# Patient Record
Sex: Female | Born: 1955 | Race: White | Hispanic: No | Marital: Married | Smoking: Current every day smoker
Health system: Southern US, Community
[De-identification: ages and names within clinical notes are randomized; demographics above are authoritative.]

## PROBLEM LIST (undated history)

## (undated) DIAGNOSIS — C801 Malignant (primary) neoplasm, unspecified: Secondary | ICD-10-CM

## (undated) DIAGNOSIS — I10 Essential (primary) hypertension: Secondary | ICD-10-CM

## (undated) HISTORY — PX: LAPAROSCOPIC LYSIS OF ADHESIONS: SHX5905

## (undated) HISTORY — PX: NASAL SEPTUM SURGERY: SHX37

---

## 2001-02-16 HISTORY — PX: BREAST BIOPSY: SHX20

## 2009-01-21 ENCOUNTER — Ambulatory Visit: Payer: Self-pay | Admitting: Obstetrics & Gynecology

## 2009-02-06 ENCOUNTER — Ambulatory Visit: Payer: Self-pay | Admitting: Obstetrics & Gynecology

## 2010-02-09 ENCOUNTER — Ambulatory Visit: Payer: Self-pay | Admitting: Obstetrics & Gynecology

## 2010-02-12 ENCOUNTER — Ambulatory Visit: Payer: Self-pay | Admitting: Obstetrics & Gynecology

## 2010-03-11 ENCOUNTER — Ambulatory Visit: Payer: Self-pay | Admitting: Surgery

## 2010-03-11 HISTORY — PX: BREAST BIOPSY: SHX20

## 2010-03-12 LAB — PATHOLOGY REPORT

## 2011-03-11 ENCOUNTER — Ambulatory Visit: Payer: Self-pay | Admitting: Obstetrics and Gynecology

## 2011-11-13 IMAGING — US ULTRASOUND LEFT BREAST
1 series · 13 of 13 positions shown · non-contrast
Comparison: none

REASON FOR EXAM: av lt mass
COMMENTS:

[Series 1: ultrasound left breast · 13 of 13 slices shown]
[im 1/13]
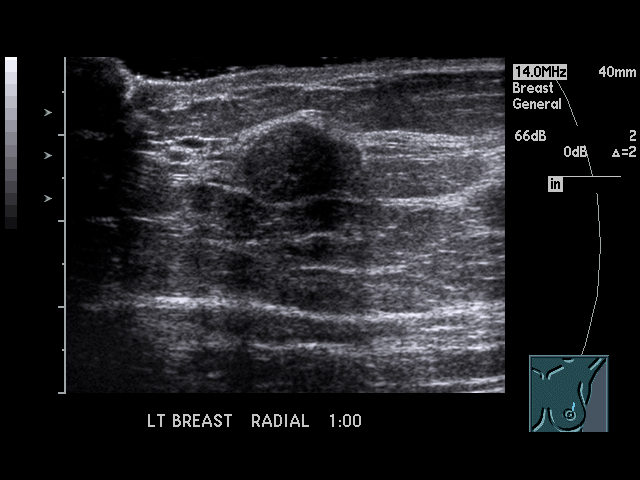
[im 2/13]
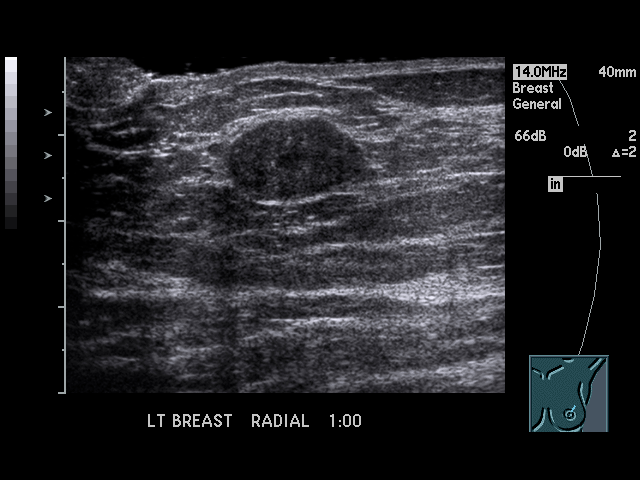
[im 3/13]
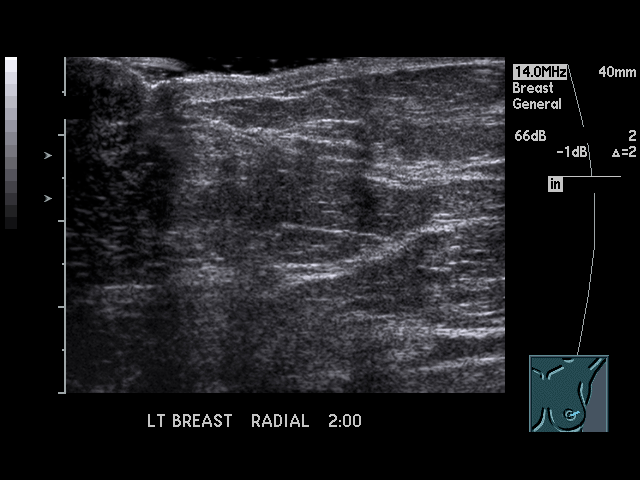
[im 4/13]
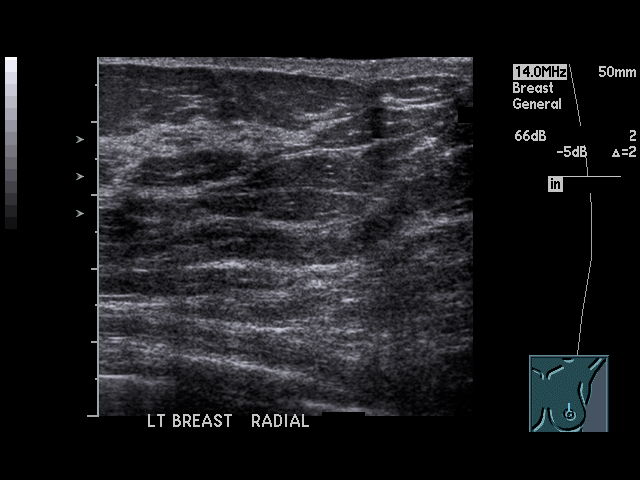
[im 5/13]
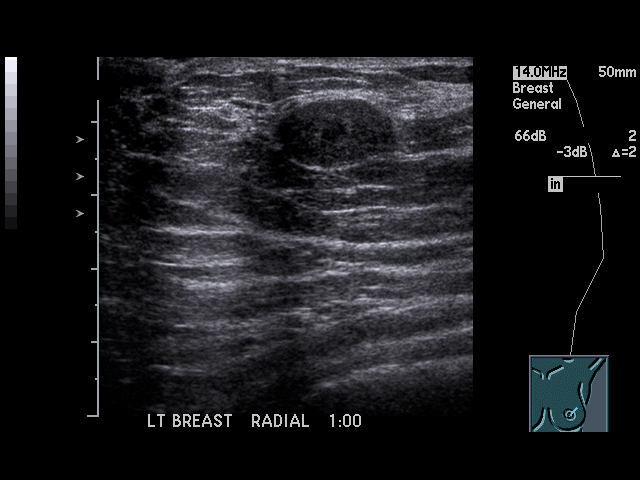
[im 6/13]
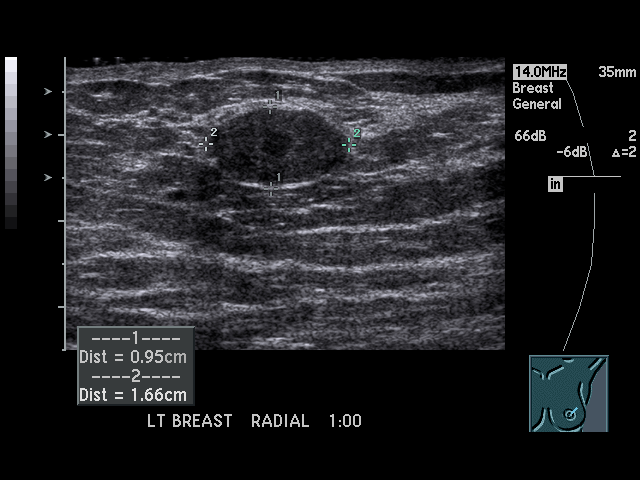
[im 7/13]
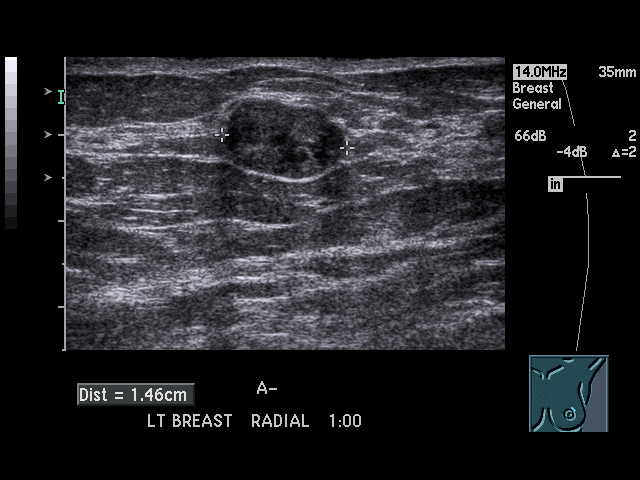
[im 8/13]
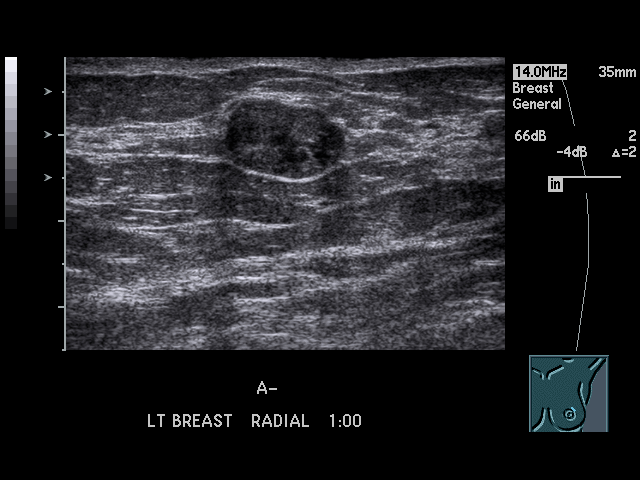
[im 9/13]
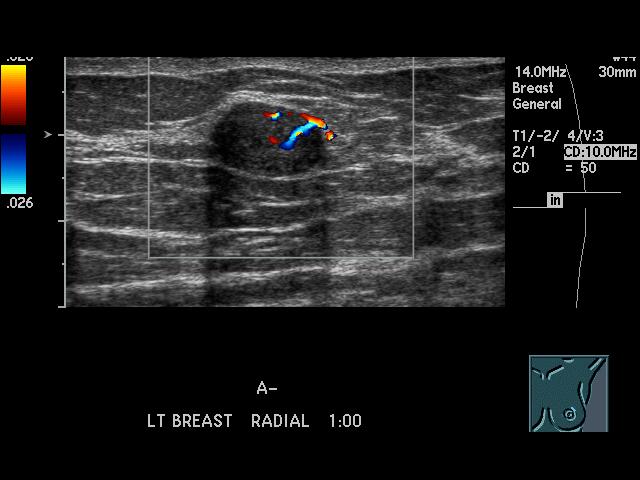
[im 10/13]
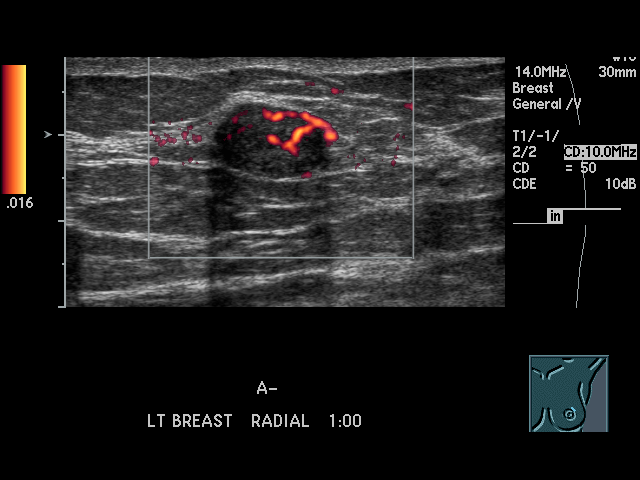
[im 11/13]
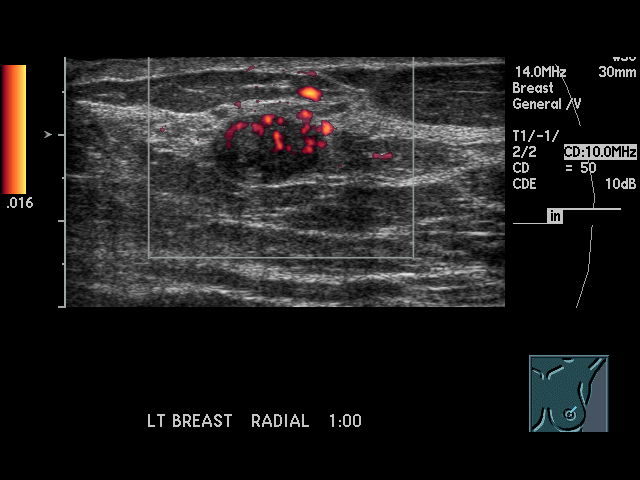
[im 12/13]
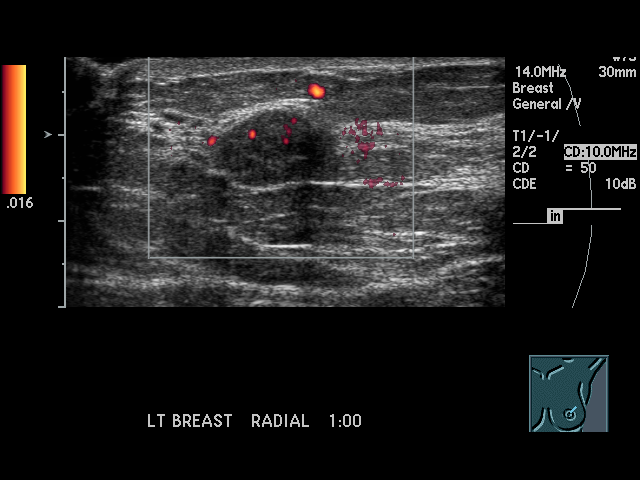
[im 13/13]
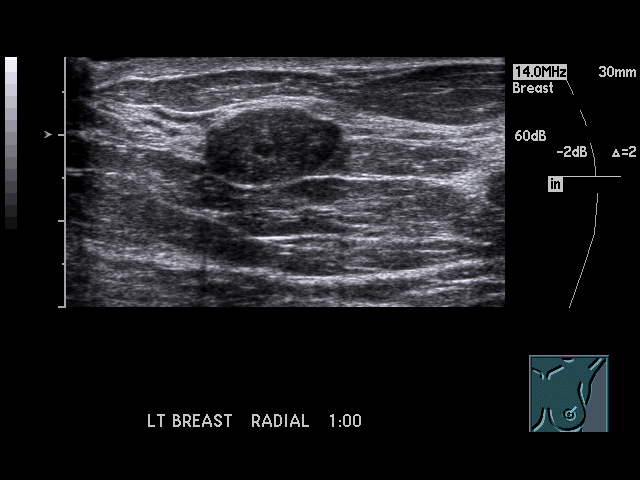

[13 of 13 positions shown; findings below may reference images not displayed]

PROCEDURE:     US  - US BREAST LEFT  - February 12, 2010  [DATE]

RESULT:

The patient had screening mammography on February 09, 2010 and returns for
additional views of the right breast on this date. Spot compression views
and a medial lateral view of the left breast were obtained. There is again
observed a mass anteriorly in the left breast located lateral to the nipple
and slightly above the nipple. The margins appear smooth. The mass measures
approximately 18 mm x 13 mm as measured mammographically. This mass is
larger than was evident on the prior screening mammogram February 06, 2009
at which time the mass measured approximately 12 mm x 10 mm. Note is made
that the patient had a similar mass described on prior mammography and prior
ultrasound examinations in 0440 and underwent biopsy of the mass at that
time.

Targeted ultrasound was also performed on this date and reveals a solid
sharply circumscribed mass superficially at 1 o'clock and measuring 1.66 cm
x 0.95 cm. The mass is vascular and there is observed posterior attenuation
of the echo beam. The mass at this time is only slightly larger than that
reported on the exam of 0440. Nonetheless, there has been a definite
increase in size in the mass as compared to the previous mammogram February 06, 2009. In view of the recent change in size, it is recommended
that the patient undergo repeat biopsy unless the finding at pathology on
prior biopsy represented a lymph node which is a distinct possibility and
which might wax and Sc in size.
IMPRESSION: 1. There is a superficial 18 mm mass anteriorly in the left breast at 1
o'clock that has increased in size since the prior mammogram February 06, 2009 but which is not dissimilar in size to that reported on prior
examinations of 0440 at which time the mass underwent biopsy. Unless the
mass on prior biopsy was shown to represent a lymph node which might be
expected to increase and decrease in size periodically, rebiopsy is
recommended in view of the change in size since the mammogram [DATE]. BIRADS:   Category 4-Suspicious Abnormality.
3. The pathology report for the prior biopsy in 0440 is not available to me
at the time of this dictation.

## 2011-12-10 IMAGING — US US BREAST BX W LOC DEV 1ST LESION IMG BX SPEC US GUIDE
1 series · 15 of 15 positions shown · non-contrast
Comparison: none

REASON FOR EXAM: Lt Breast Mass
COMMENTS:

[Series 1: us breast bx w loc dev 1st lesion img bx spec us g · 15 of 15 slices shown]
[im 1/15]
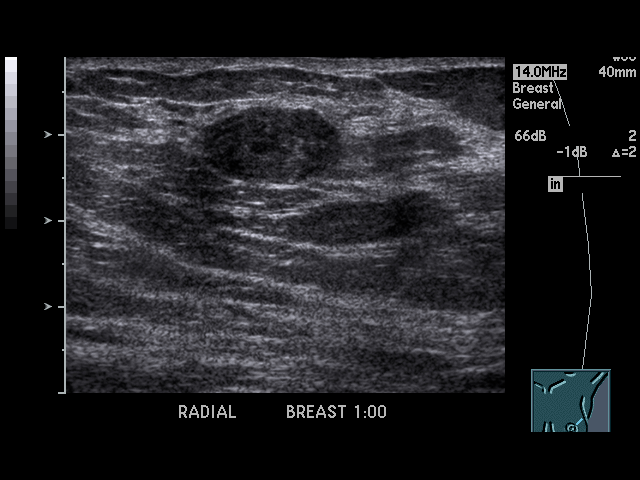
[im 2/15]
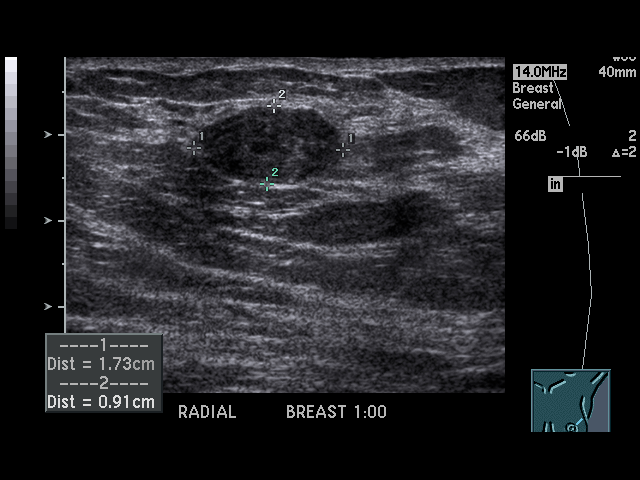
[im 3/15]
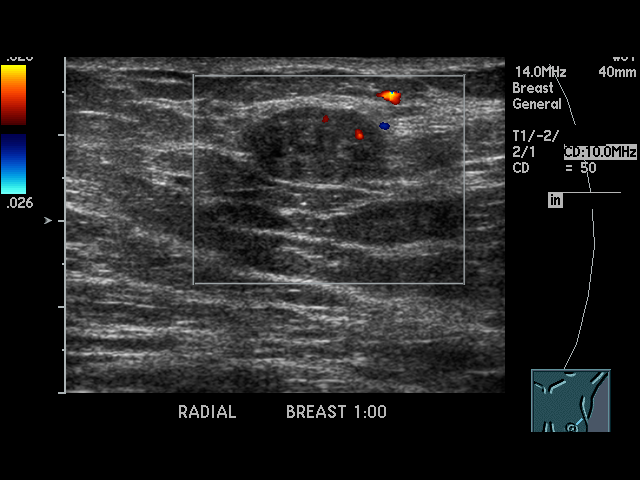
[im 4/15]
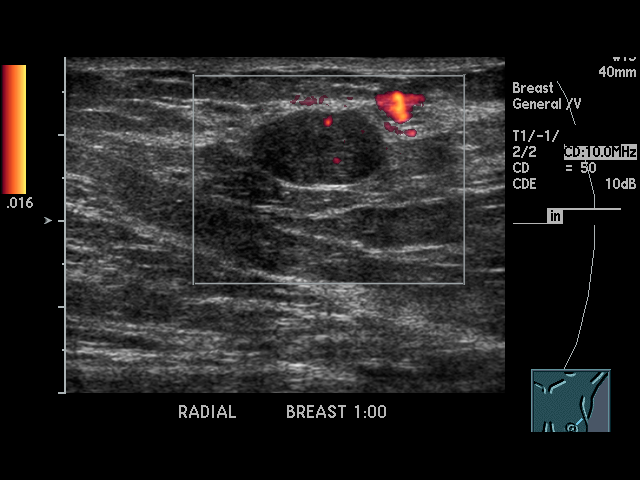
[im 5/15]
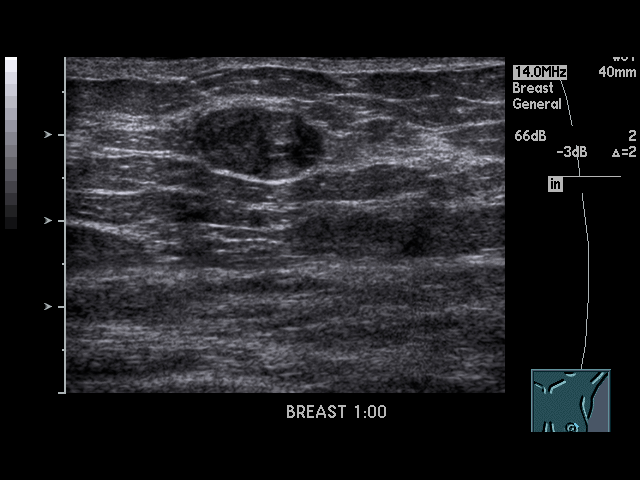
[im 6/15]
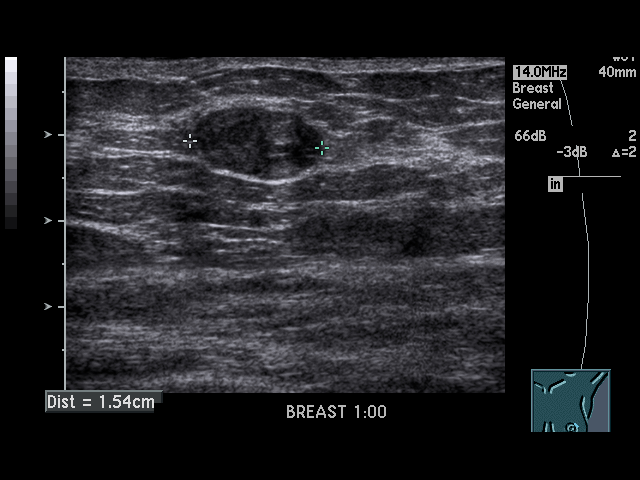
[im 7/15]
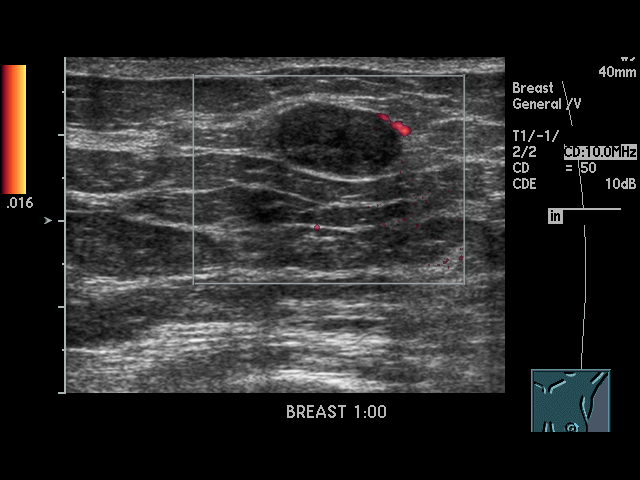
[im 8/15]
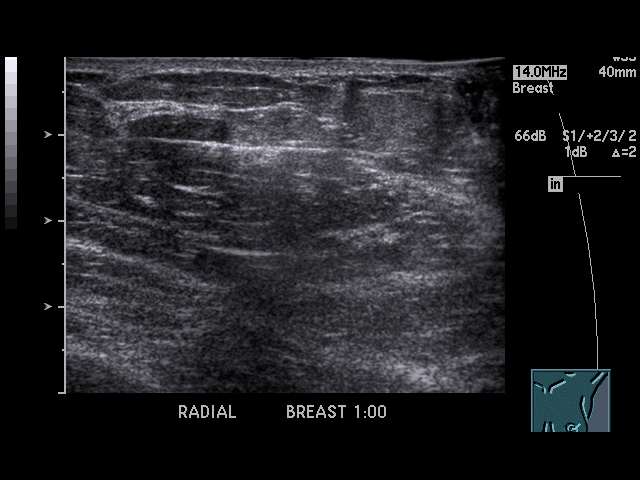
[im 9/15]
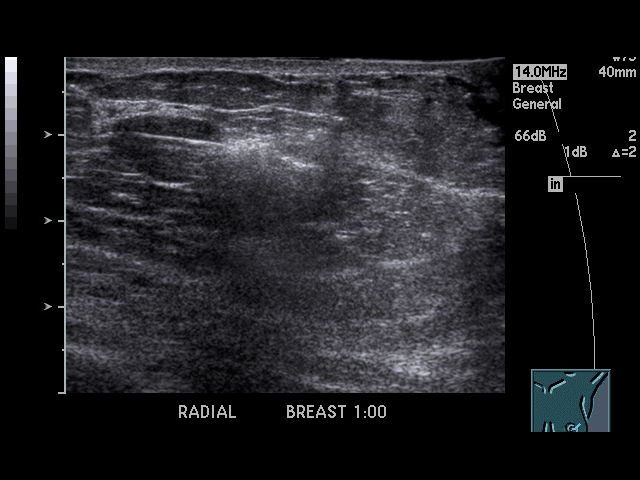
[im 10/15]
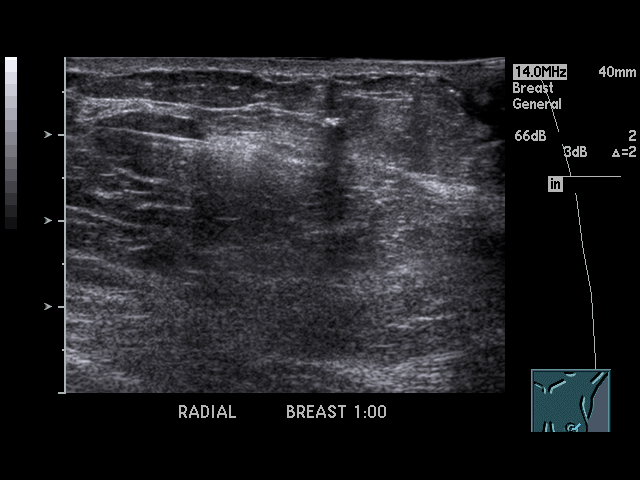
[im 11/15]
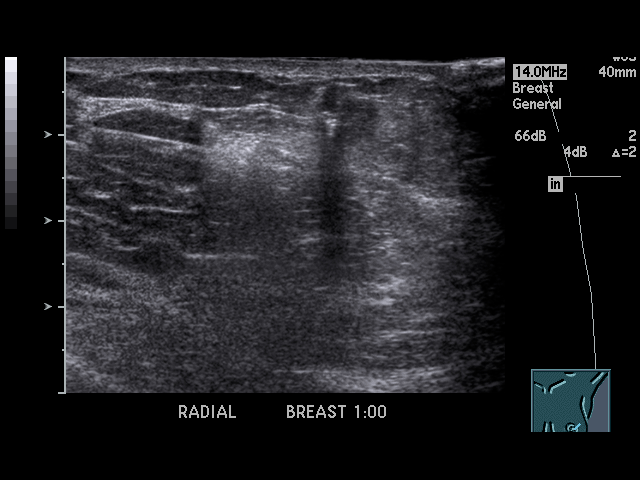
[im 12/15]
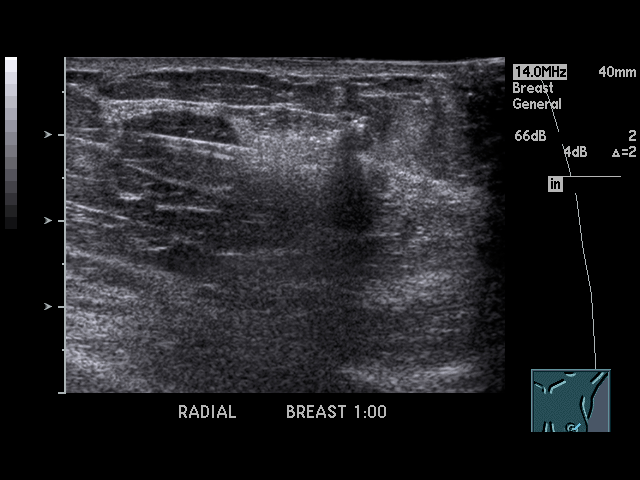
[im 13/15]
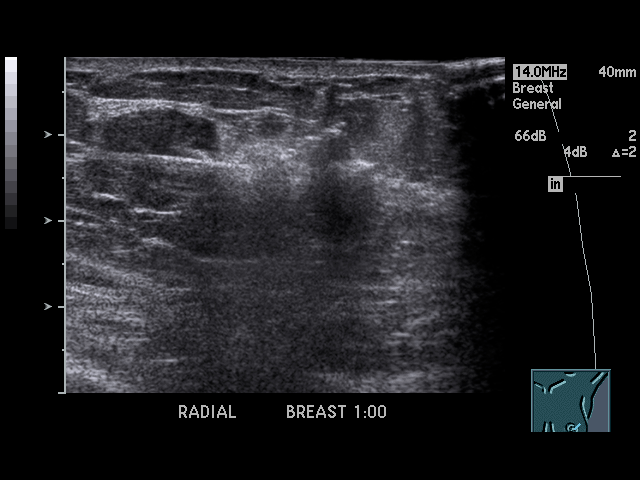
[im 14/15]
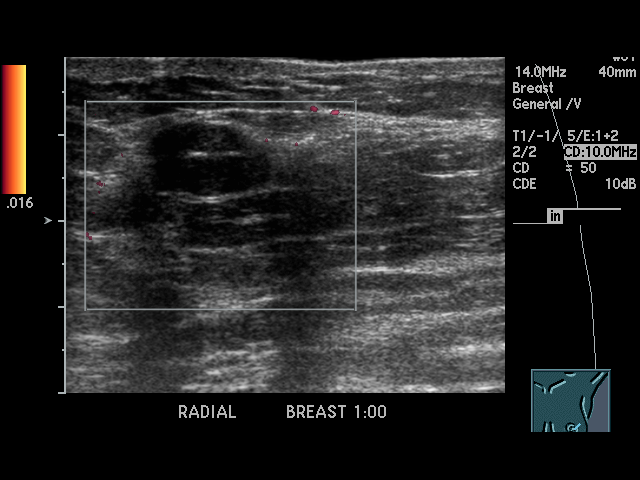
[im 15/15]
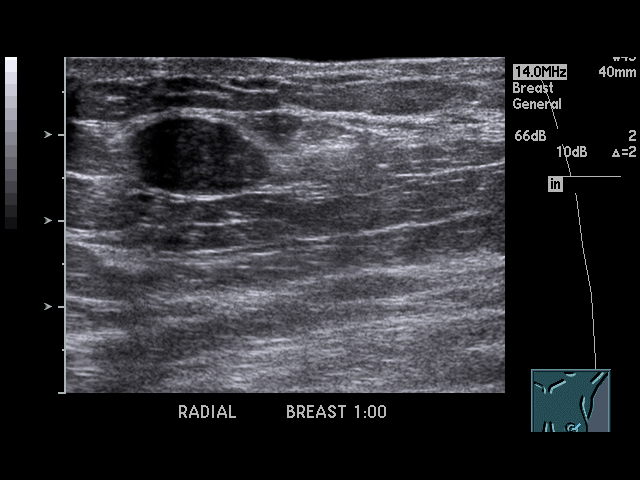

[15 of 15 positions shown; findings below may reference images not displayed]

PROCEDURE:     US  - US GUIDED BIOPSY BREAST LEFT  - March 11, 2010  [DATE]

RESULT:     Comparison: Ultrasound of the left breast 02/12/2010

Consent: After the procedure and its risks including infection, bleeding and
allergy were discussed with the patient and all questions answered, informed
consent was signed.

Procedure: The patient was placed supine on the ultrasound with the left arm
in ABER position. A "timeout" was performed to verify the correct patient
and correct procedure. Initial ultrasound of the left breast demonstrated
solid mass there is wider than tall at [DATE]. The patient was then prepped
and draped in the usual sterile fashion. Realtime ultrasound was used to
localize the mass and a biopsy path was selected. The skin was marked and 1%
lidocaine was infiltrated into skin and soft tissues along the biopsy tract
up to the mass for local anesthesia.

A small skin incision with a #11 blade was made at the skin entry site. A
01gauge coaxial needle was advanced to the margin of the mass. Subsequently,
6 core biopsy specimens were obtained using an Achieve 18 gauge needle in
semiautomatic mode. Needle passes into the mass were observed under realtime
and recorded. The needle was removed, hemostasis achieved and a band-aid
applied to the skin incision. The patient tolerated the procedure well
without immediate complication and was discharged in good condition after 15
min observation.
IMPRESSION: Successful left breast mass core biopsy.

## 2013-01-23 ENCOUNTER — Ambulatory Visit: Payer: Self-pay | Admitting: Nurse Practitioner

## 2014-01-31 ENCOUNTER — Ambulatory Visit: Payer: Self-pay | Admitting: Family Medicine

## 2015-01-20 ENCOUNTER — Other Ambulatory Visit: Payer: Self-pay | Admitting: Family Medicine

## 2015-01-20 DIAGNOSIS — Z1231 Encounter for screening mammogram for malignant neoplasm of breast: Secondary | ICD-10-CM

## 2015-02-11 ENCOUNTER — Ambulatory Visit
Admission: RE | Admit: 2015-02-11 | Discharge: 2015-02-11 | Disposition: A | Discharge status: Home / Self Care | Payer: Managed Care, Other (non HMO) | Source: Ambulatory Visit | Attending: Family Medicine | Admitting: Family Medicine

## 2015-02-11 DIAGNOSIS — Z1231 Encounter for screening mammogram for malignant neoplasm of breast: Secondary | ICD-10-CM

## 2015-02-11 HISTORY — DX: Malignant (primary) neoplasm, unspecified: C80.1

## 2016-01-28 ENCOUNTER — Other Ambulatory Visit: Payer: Self-pay | Admitting: Family Medicine

## 2016-01-28 DIAGNOSIS — Z1231 Encounter for screening mammogram for malignant neoplasm of breast: Secondary | ICD-10-CM

## 2016-03-02 ENCOUNTER — Ambulatory Visit
Admission: RE | Admit: 2016-03-02 | Discharge: 2016-03-02 | Disposition: A | Discharge status: Home / Self Care | Payer: Managed Care, Other (non HMO) | Source: Ambulatory Visit | Attending: Family Medicine | Admitting: Family Medicine

## 2016-03-02 DIAGNOSIS — Z1231 Encounter for screening mammogram for malignant neoplasm of breast: Secondary | ICD-10-CM

## 2016-03-02 DIAGNOSIS — N6489 Other specified disorders of breast: Secondary | ICD-10-CM | POA: Diagnosis not present

## 2017-01-28 ENCOUNTER — Other Ambulatory Visit: Payer: Self-pay | Admitting: Family Medicine

## 2017-01-28 DIAGNOSIS — Z1239 Encounter for other screening for malignant neoplasm of breast: Secondary | ICD-10-CM

## 2017-03-03 ENCOUNTER — Encounter (INDEPENDENT_AMBULATORY_CARE_PROVIDER_SITE_OTHER): Payer: Self-pay

## 2017-03-03 ENCOUNTER — Ambulatory Visit
Admission: RE | Admit: 2017-03-03 | Discharge: 2017-03-03 | Disposition: A | Discharge status: Home / Self Care | Payer: 59 | Source: Ambulatory Visit | Attending: Family Medicine | Admitting: Family Medicine

## 2017-03-03 DIAGNOSIS — Z1239 Encounter for other screening for malignant neoplasm of breast: Secondary | ICD-10-CM

## 2017-03-03 DIAGNOSIS — Z1231 Encounter for screening mammogram for malignant neoplasm of breast: Secondary | ICD-10-CM | POA: Insufficient documentation

## 2018-03-27 ENCOUNTER — Telehealth: Payer: Self-pay | Admitting: *Deleted

## 2018-03-27 DIAGNOSIS — Z122 Encounter for screening for malignant neoplasm of respiratory organs: Secondary | ICD-10-CM

## 2018-03-27 DIAGNOSIS — Z87891 Personal history of nicotine dependence: Secondary | ICD-10-CM

## 2018-03-27 NOTE — Telephone Encounter (Signed)
Received referral for initial lung cancer screening scan. Contacted patient and obtained smoking history,(current, 72 pack year) as well as answering questions related to screening process. Patient denies signs of lung cancer such as weight loss or hemoptysis. Patient denies comorbidity that would prevent curative treatment if lung cancer were found. Patient is scheduled for shared decision making visit and CT scan on 04/06/18 at 2pm.

## 2018-04-06 ENCOUNTER — Ambulatory Visit: Payer: Self-pay

## 2018-04-06 ENCOUNTER — Inpatient Hospital Stay: Payer: Self-pay | Admitting: Nurse Practitioner

## 2018-04-19 ENCOUNTER — Telehealth: Payer: Self-pay | Admitting: *Deleted

## 2018-04-19 NOTE — Telephone Encounter (Signed)
Called pt to remind her of her ldct screening on 04-20-2018@1400 , pt rescheduled to Monday already spoke with shawn perkins rn

## 2018-04-20 ENCOUNTER — Inpatient Hospital Stay: Payer: Self-pay | Admitting: Nurse Practitioner

## 2018-04-20 ENCOUNTER — Ambulatory Visit: Payer: No Typology Code available for payment source

## 2018-04-21 ENCOUNTER — Telehealth: Payer: Self-pay | Admitting: *Deleted

## 2018-04-21 NOTE — Telephone Encounter (Signed)
Called pt to inform her of her appt for ldct screening on Monday 04-24-2018@1515 , voiced understanding

## 2018-04-24 ENCOUNTER — Inpatient Hospital Stay: Payer: No Typology Code available for payment source | Attending: Nurse Practitioner | Admitting: Oncology

## 2018-04-24 ENCOUNTER — Encounter: Payer: Self-pay | Admitting: Oncology

## 2018-04-24 ENCOUNTER — Ambulatory Visit
Admission: RE | Admit: 2018-04-24 | Discharge: 2018-04-24 | Disposition: A | Discharge status: Home / Self Care | Payer: No Typology Code available for payment source | Source: Ambulatory Visit | Attending: Nurse Practitioner | Admitting: Nurse Practitioner

## 2018-04-24 DIAGNOSIS — Z87891 Personal history of nicotine dependence: Secondary | ICD-10-CM | POA: Insufficient documentation

## 2018-04-24 DIAGNOSIS — Z122 Encounter for screening for malignant neoplasm of respiratory organs: Secondary | ICD-10-CM | POA: Diagnosis present

## 2018-04-24 NOTE — Progress Notes (Signed)
In accordance with CMS guidelines, patient has met eligibility criteria including age, absence of signs or symptoms of lung cancer.  Social History   Tobacco Use  . Smoking status: Current Every Day Smoker    Packs/day: 1.50    Years: 48.00    Pack years: 72.00    Types: Cigarettes  Substance Use Topics  . Alcohol use: Not on file  . Drug use: Not on file     A shared decision-making session was conducted prior to the performance of CT scan. This includes one or more decision aids, includes benefits and harms of screening, follow-up diagnostic testing, over-diagnosis, false positive rate, and total radiation exposure.  Counseling on the importance of adherence to annual lung cancer LDCT screening, impact of co-morbidities, and ability or willingness to undergo diagnosis and treatment is imperative for compliance of the program.  Counseling on the importance of continued smoking cessation for former smokers; the importance of smoking cessation for current smokers, and information about tobacco cessation interventions have been given to patient including [place] and [employer] quit [place] programs.  Written order for lung cancer screening with LDCT has been given to the patient and any and all questions have been answered to the best of my abilities.   Yearly follow up will be coordinated by Burgess Estelle, Thoracic Navigator.  Faythe Casa, NP 04/24/2018 3:51 PM

## 2018-04-25 ENCOUNTER — Encounter: Payer: Self-pay | Admitting: *Deleted

## 2018-05-03 ENCOUNTER — Encounter: Payer: Self-pay | Admitting: *Deleted

## 2018-05-03 NOTE — H&P (Signed)
Deborah Leon is a 63 y.o. female here for pelvic pain .  Pt returns today for f/up for chronic pelvic pain , right > left . She has 2small simple ovarian cysts. Pain has persisted . Neg CA-125 level . Colonoscopy : 1 polyp and small divericula noted .  Pain is daily with some exacerbations . Bowel prep with colonoscopy- pain dissipated the day after the colonoscopy   prior L/S LOA in 1990  Past Medical History:  has a past medical history of Abnormal cytology (2003), Allergic state, Aortic atherosclerosis (CMS-HCC) (02/19/2017), Arthritis, Cancer (CMS-HCC) (2008), Cataract cortical, senile, Hyperlipidemia, Hypertension, and Simple chronic bronchitis (CMS-HCC) (02/19/2017).  Past Surgical History:  has a past surgical history that includes Deviated septum repair; Diagnostic laparoscopy with lysis of adhesions (1990); S/P breast biopsy ; colposcopy (2002); and Colonoscopy (04/07/2018). Family History: family history includes Alzheimer's disease in her father; Aneurysm in her sister; Diabetes type II in her mother and sister; Factor V Leiden deficiency in her sister; High blood pressure (Hypertension) in her mother, sister, and sister; Stroke in her sister. Social History:  reports that she has been smoking. She has a 40.00 pack-year smoking history. She has never used smokeless tobacco. She reports that she does not drink alcohol or use drugs. OB/GYN History:          OB History    Gravida  3   Para  2   Term      Preterm      AB  1   Living  2     SAB      TAB      Ectopic      Molar      Multiple      Live Births  2          Allergies: is allergic to penicillins. Medications:  Current Outpatient Medications:  .  glucosamine sulfate 500 mg Tab, Take 1 tablet by mouth once daily  , Disp: , Rfl:  .  lisinopril-hydrochlorothiazide (ZESTORETIC) 10-12.5 mg tablet, TAKE 1 TABLET BY MOUTH ONCE DAILY FOR BLOOD PRESSURE, Disp: 90 tablet, Rfl: 0 .  multivitamin tablet,  Take 1 tablet by mouth once daily, Disp: , Rfl:  .  naproxen (NAPROSYN) 500 MG tablet, TAKE 1 TABLET BY MOUTH TWICE DAILY WITH MEALS FOR 15 DAYS, Disp: , Rfl:  .  omega-3 fatty acids/fish oil 340-1,000 mg capsule, Take 1 capsule by mouth 2 (two) times daily., Disp: , Rfl:  .  pravastatin (PRAVACHOL) 10 MG tablet, TAKE 1 TABLET BY MOUTH ONCE DAILY FOR CHOLESTEROL, Disp: 90 tablet, Rfl: 0 .  ondansetron (ZOFRAN-ODT) 4 MG disintegrating tablet, Take 4 mg by mouth every 8 (eight) hours as needed for Nausea  , Disp: , Rfl:   Review of Systems: General:                      No fatigue or weight loss Eyes:                           No vision changes Ears:                            No hearing difficulty Respiratory:                No cough or shortness of breath Pulmonary:  No asthma or shortness of breath Cardiovascular:           No chest pain, palpitations, dyspnea on exertion Gastrointestinal:          No abdominal bloating, chronic diarrhea, constipations, masses, pain or hematochezia Genitourinary:             No hematuria, dysuria, abnormal vaginal discharge, pelvic pain, Menometrorrhagia Lymphatic:                   No swollen lymph nodes Musculoskeletal:         No muscle weakness Neurologic:                  No extremity weakness, syncope, seizure disorder Psychiatric:                  No history of depression, delusions or suicidal/homicidal ideation    Exam:   Vitals:   04/20/18 1417  BP: 124/72  Pulse: 109    Body mass index is 27.12 kg/m.  WDWN white/ female in NAD   Lungs: CTA  CV : RRR without murmur    Neck:  no thyromegaly Abdomen: soft , no mass, normal active bowel sounds,  non-tender, no rebound tenderness Pelvic: tanner stage 5 ,  External genitalia: vulva /labia no lesions Urethra: no prolapse Vagina: normal physiologic d/c Cervix: no lesions, no cervical motion tenderness   Uterus: normal size shape and contour, non-tender Adnexa:  no mass, right adnexal TTP   Rectovaginal:  Impression:   The primary encounter diagnosis was Pelvic pain in female. A diagnosis of Bilateral ovarian cysts was also pertinent to this visit.    Plan:   Given pain has persisted and she had a prior h/o pelvic adhesions I have offered Operative L/S and BSO . she will elect to have this procedure         Caroline Sauger, MD

## 2018-05-04 ENCOUNTER — Encounter
Admission: RE | Admit: 2018-05-04 | Discharge: 2018-05-04 | Disposition: A | Discharge status: Home / Self Care | Payer: No Typology Code available for payment source | Source: Ambulatory Visit | Attending: Obstetrics and Gynecology | Admitting: Obstetrics and Gynecology

## 2018-05-04 ENCOUNTER — Other Ambulatory Visit: Payer: Self-pay

## 2018-05-04 DIAGNOSIS — Z01818 Encounter for other preprocedural examination: Secondary | ICD-10-CM | POA: Diagnosis not present

## 2018-05-04 DIAGNOSIS — I10 Essential (primary) hypertension: Secondary | ICD-10-CM | POA: Diagnosis not present

## 2018-05-04 HISTORY — DX: Essential (primary) hypertension: I10

## 2018-05-04 LAB — BASIC METABOLIC PANEL
Anion gap: 8 (ref 5–15)
BUN: 12 mg/dL (ref 8–23)
CO2: 25 mmol/L (ref 22–32)
Calcium: 9.3 mg/dL (ref 8.9–10.3)
Chloride: 104 mmol/L (ref 98–111)
Creatinine, Ser: 0.64 mg/dL (ref 0.44–1.00)
GFR calc Af Amer: >60 mL/min (ref >60)
GFR calc non Af Amer: >60 mL/min (ref >60)
Glucose, Bld: 94 mg/dL (ref 70–99)
POTASSIUM: 3.6 mmol/L (ref 3.5–5.1)
Sodium: 137 mmol/L (ref 135–145)

## 2018-05-04 LAB — CBC
HEMATOCRIT: 47.2 % — AB (ref 36.0–46.0)
HEMOGLOBIN: 15.5 g/dL — AB (ref 12.0–15.0)
MCH: 30.5 pg (ref 26.0–34.0)
MCHC: 32.8 g/dL (ref 30.0–36.0)
MCV: 92.7 fL (ref 80.0–100.0)
Platelets: 302 10*3/uL (ref 150–400)
RBC: 5.09 MIL/uL (ref 3.87–5.11)
RDW: 14 % (ref 11.5–15.5)
WBC: 9.3 10*3/uL (ref 4.0–10.5)
nRBC: 0 % (ref 0.0–0.2)

## 2018-05-04 LAB — TYPE AND SCREEN
ABO/RH(D): O POS
Antibody Screen: NEGATIVE

## 2018-05-04 NOTE — Pre-Procedure Instructions (Signed)
   ECG 12-lead5/29/2018 [place] Component Name Value Ref Range  Vent Rate (bpm) 90   PR Interval (msec) 180   QRS Interval (msec) 70   QT Interval (msec) 346   QTc (msec) 423   Other Result Information  This result has an attachment that is not available.  Result Narrative  Normal sinus rhythm with sinus arrhythmia Biatrial enlargement Abnormal ECG No previous ECGs available I reviewed and concur with this report. Electronically signed MH:[email or web address], MD, [place] [id number]) on 11/04/2016 1:32:51 PM

## 2018-05-04 NOTE — Patient Instructions (Signed)
Your procedure is scheduled on: Monday 04/16/18 Report to [facility]. To find out your arrival time please call [phone] between 1PM - 3PM on Friday 05/12/18.  Remember: Instructions that are not followed completely may result in serious medical risk, up to and including death, or upon the discretion of your surgeon and anesthesiologist your surgery may need to be rescheduled.     _X__ 1. Do not eat food after midnight the night before your procedure.                 No gum chewing or hard candies. You may drink clear liquids up to 2 hours                 before you are scheduled to arrive for your surgery- DO not drink clear                 liquids within 2 hours of the start of your surgery.                 Clear Liquids include:  water, apple juice without pulp, clear carbohydrate                 drink such as Clearfast or Gatorade, Black Coffee or Tea (Do not add                 anything to coffee or tea).  __X__2.  On the morning of surgery brush your teeth with toothpaste and water, you                 may rinse your mouth with mouthwash if you wish.  Do not swallow any              toothpaste of mouthwash.     _X__ 3.  No Alcohol for 24 hours before or after surgery.   _X__ 4.  Do Not Smoke or use e-cigarettes For 24 Hours Prior to Your Surgery.                 Do not use any chewable tobacco products for at least 6 hours prior to                 surgery.  ____  5.  Bring all medications with you on the day of surgery if instructed.   __X__  6.  Notify your doctor if there is any change in your medical condition      (cold, fever, infections).     Do not wear jewelry, make-up, hairpins, clips or nail polish. Do not wear lotions, powders, or perfumes.  Do not shave 48 hours prior to surgery. Men may shave face and neck. Do not bring valuables to the hospital.    [facility] is not responsible for any belongings or  valuables.  Contacts, dentures/partials or body piercings may not be worn into surgery. Bring a case for your contacts, glasses or hearing aids, a denture cup will be supplied. Leave your suitcase in the car. After surgery it may be brought to your room. For patients admitted to the hospital, discharge time is determined by your treatment team.   Patients discharged the day of surgery will not be allowed to drive home.   Please read over the following fact sheets that you were given:   MRSA Information  __X__ Take these medicines the morning of surgery with A SIP OF WATER:  1. NONE  2.   3.   4.  5.  6.  ____ Fleet Enema (as directed)   __X__ Use CHG Soap/SAGE wipes as directed  ____ Use inhalers on the day of surgery  ____ Stop metformin/Janumet/Farxiga 2 days prior to surgery    ____ Take 1/2 of usual insulin dose the night before surgery. No insulin the morning          of surgery.   ____ Stop Blood Thinners Coumadin/Plavix/Xarelto/Pleta/Pradaxa/Eliquis/Effient/Aspirin  on   Or contact your Surgeon, Cardiologist or Medical Doctor regarding  ability to stop your blood thinners  __X__ Stop Anti-inflammatories 7 days before surgery such as Advil, Ibuprofen, Motrin,  BC or Goodies Powder, Naprosyn, Naproxen, Aleve, Aspirin    __X__ Stop all herbal supplements, fish oil or vitamin E for 7 days until after surgery. Stop Glucosamine, Omega 3   ____ Bring C-Pap to the hospital.

## 2018-05-04 NOTE — Pre-Procedure Instructions (Signed)
EKG reviewed by Dr Amie Critchley. Clearance requested and faxed. PMD and GYN notified.

## 2018-05-08 NOTE — Pre-Procedure Instructions (Addendum)
DR THIESS SENT REQUEST FOR CLEARANCE FORM BACK  STATING HE IS NOT PCP. SPOKE WITH PATIENT. [facility] PCP OFFICE BUT COULD NOT NAME MAIN PCP. SEES DIFFERENT ONES. EXPLAINED NEED FOR EKG TO BE ADDRESSED PREOP. SPOKE WITH MEGAN AT [facility] FORMS/ EKG TO ATTENTION/ [facility] REQUESTING CLEARANCE OR REFERRAL TO CARDIOLOGY BE SET UP BY THEIR OFFICE IF INDICATED.ALSO SPOKE WITH ANGIE AT DR Ouida Sills

## 2018-05-10 NOTE — Pre-Procedure Instructions (Signed)
LM FOR JENNA,SUPERVISOR AT PCP OFFICE TO HAVE HER ADDRESS 2 TELEPHONE ENCOUNTERS FROM 05/08/18. SHE IS OUT OF OFFICE UNTIL 05/12/18. ASKED FOR CALL BACK ON Monday TO DISCUSS

## 2018-05-11 NOTE — Pre-Procedure Instructions (Signed)
CARDIAC CLEARANCE ON CHART 

## 2018-05-15 ENCOUNTER — Ambulatory Visit: Payer: No Typology Code available for payment source | Admitting: Anesthesiology

## 2018-05-15 ENCOUNTER — Ambulatory Visit
Admission: RE | Admit: 2018-05-15 | Discharge: 2018-05-15 | Disposition: A | Discharge status: Home / Self Care | Payer: No Typology Code available for payment source | Attending: Obstetrics and Gynecology | Admitting: Obstetrics and Gynecology

## 2018-05-15 ENCOUNTER — Encounter: Payer: Self-pay | Admitting: *Deleted

## 2018-05-15 ENCOUNTER — Other Ambulatory Visit: Payer: Self-pay

## 2018-05-15 ENCOUNTER — Encounter
Admission: RE | Disposition: A | Discharge status: Home / Self Care | Payer: Self-pay | Source: Home / Self Care | Attending: Obstetrics and Gynecology

## 2018-05-15 DIAGNOSIS — N736 Female pelvic peritoneal adhesions (postinfective): Secondary | ICD-10-CM | POA: Insufficient documentation

## 2018-05-15 DIAGNOSIS — I10 Essential (primary) hypertension: Secondary | ICD-10-CM | POA: Diagnosis not present

## 2018-05-15 DIAGNOSIS — Z832 Family history of diseases of the blood and blood-forming organs and certain disorders involving the immune mechanism: Secondary | ICD-10-CM | POA: Insufficient documentation

## 2018-05-15 DIAGNOSIS — Z823 Family history of stroke: Secondary | ICD-10-CM | POA: Diagnosis not present

## 2018-05-15 DIAGNOSIS — Z8249 Family history of ischemic heart disease and other diseases of the circulatory system: Secondary | ICD-10-CM | POA: Insufficient documentation

## 2018-05-15 DIAGNOSIS — F17200 Nicotine dependence, unspecified, uncomplicated: Secondary | ICD-10-CM | POA: Diagnosis not present

## 2018-05-15 DIAGNOSIS — Z880 Allergy status to penicillin: Secondary | ICD-10-CM | POA: Diagnosis not present

## 2018-05-15 DIAGNOSIS — G8929 Other chronic pain: Secondary | ICD-10-CM | POA: Diagnosis present

## 2018-05-15 DIAGNOSIS — Z820 Family history of epilepsy and other diseases of the nervous system: Secondary | ICD-10-CM | POA: Insufficient documentation

## 2018-05-15 DIAGNOSIS — N83202 Unspecified ovarian cyst, left side: Secondary | ICD-10-CM | POA: Diagnosis not present

## 2018-05-15 DIAGNOSIS — N83201 Unspecified ovarian cyst, right side: Secondary | ICD-10-CM | POA: Insufficient documentation

## 2018-05-15 DIAGNOSIS — N838 Other noninflammatory disorders of ovary, fallopian tube and broad ligament: Secondary | ICD-10-CM | POA: Diagnosis not present

## 2018-05-15 DIAGNOSIS — E785 Hyperlipidemia, unspecified: Secondary | ICD-10-CM | POA: Insufficient documentation

## 2018-05-15 DIAGNOSIS — Z833 Family history of diabetes mellitus: Secondary | ICD-10-CM | POA: Insufficient documentation

## 2018-05-15 DIAGNOSIS — Z79899 Other long term (current) drug therapy: Secondary | ICD-10-CM | POA: Diagnosis not present

## 2018-05-15 DIAGNOSIS — Z791 Long term (current) use of non-steroidal anti-inflammatories (NSAID): Secondary | ICD-10-CM | POA: Diagnosis not present

## 2018-05-15 DIAGNOSIS — M1990 Unspecified osteoarthritis, unspecified site: Secondary | ICD-10-CM | POA: Diagnosis not present

## 2018-05-15 DIAGNOSIS — I700 Atherosclerosis of aorta: Secondary | ICD-10-CM | POA: Diagnosis not present

## 2018-05-15 HISTORY — PX: LAPAROSCOPY: SHX197

## 2018-05-15 HISTORY — PX: LAPAROSCOPIC LYSIS OF ADHESIONS: SHX5905

## 2018-05-15 HISTORY — PX: LAPAROSCOPIC BILATERAL SALPINGO OOPHERECTOMY: SHX5890

## 2018-05-15 LAB — ABO/RH: ABO/RH(D): O POS

## 2018-05-15 SURGERY — SALPINGO-OOPHORECTOMY, BILATERAL, LAPAROSCOPIC
Anesthesia: General

## 2018-05-15 MED ORDER — OXYCODONE HCL 5 MG PO TABS
ORAL_TABLET | ORAL | Status: AC
  Administered 2018-05-15: 5 mg via ORAL
  Filled 2018-05-15: qty 1

## 2018-05-15 MED ORDER — SILVER NITRATE-POT NITRATE 75-25 % EX MISC
CUTANEOUS | Status: AC
  Filled 2018-05-15: qty 1

## 2018-05-15 MED ORDER — SUGAMMADEX SODIUM 200 MG/2ML IV SOLN
INTRAVENOUS | Status: AC
  Filled 2018-05-15: qty 2

## 2018-05-15 MED ORDER — ONDANSETRON HCL 4 MG/2ML IJ SOLN
INTRAMUSCULAR | Status: DC | PRN
  Administered 2018-05-15: 4 mg via INTRAVENOUS

## 2018-05-15 MED ORDER — CELECOXIB 200 MG PO CAPS
ORAL_CAPSULE | ORAL | Status: AC
  Administered 2018-05-15: 400 mg via ORAL
  Filled 2018-05-15: qty 2

## 2018-05-15 MED ORDER — FENTANYL CITRATE (PF) 100 MCG/2ML IJ SOLN
25.0000 ug | INTRAMUSCULAR | Status: AC | PRN
  Administered 2018-05-15 (×6): 25 ug via INTRAVENOUS

## 2018-05-15 MED ORDER — PROPOFOL 10 MG/ML IV BOLUS
INTRAVENOUS | Status: AC
  Filled 2018-05-15: qty 40

## 2018-05-15 MED ORDER — FAMOTIDINE 20 MG PO TABS
ORAL_TABLET | ORAL | Status: AC
  Administered 2018-05-15: 20 mg via ORAL
  Filled 2018-05-15: qty 1

## 2018-05-15 MED ORDER — CELECOXIB 200 MG PO CAPS
400.0000 mg | ORAL_CAPSULE | ORAL | Status: AC
  Administered 2018-05-15: 400 mg via ORAL

## 2018-05-15 MED ORDER — SUGAMMADEX SODIUM 200 MG/2ML IV SOLN
INTRAVENOUS | Status: DC | PRN
  Administered 2018-05-15: 150 mg via INTRAVENOUS

## 2018-05-15 MED ORDER — GABAPENTIN 300 MG PO CAPS
300.0000 mg | ORAL_CAPSULE | ORAL | Status: AC
  Administered 2018-05-15: 300 mg via ORAL

## 2018-05-15 MED ORDER — PHENYLEPHRINE HCL 10 MG/ML IJ SOLN
INTRAMUSCULAR | Status: AC
  Filled 2018-05-15: qty 1

## 2018-05-15 MED ORDER — FENTANYL CITRATE (PF) 100 MCG/2ML IJ SOLN
INTRAMUSCULAR | Status: AC
  Administered 2018-05-15: 25 ug via INTRAVENOUS
  Filled 2018-05-15: qty 2

## 2018-05-15 MED ORDER — SOD CITRATE-CITRIC ACID 500-334 MG/5ML PO SOLN
30.0000 mL | ORAL | Status: DC
  Filled 2018-05-15: qty 30

## 2018-05-15 MED ORDER — DEXAMETHASONE SODIUM PHOSPHATE 10 MG/ML IJ SOLN
INTRAMUSCULAR | Status: DC | PRN
  Administered 2018-05-15: 4 mg via INTRAVENOUS

## 2018-05-15 MED ORDER — FENTANYL CITRATE (PF) 100 MCG/2ML IJ SOLN
INTRAMUSCULAR | Status: AC
  Filled 2018-05-15: qty 2

## 2018-05-15 MED ORDER — ACETAMINOPHEN 500 MG PO TABS
1000.0000 mg | ORAL_TABLET | ORAL | Status: AC
  Administered 2018-05-15: 1000 mg via ORAL

## 2018-05-15 MED ORDER — CELECOXIB 200 MG PO CAPS
ORAL_CAPSULE | ORAL | Status: AC
  Filled 2018-05-15: qty 1

## 2018-05-15 MED ORDER — GABAPENTIN 300 MG PO CAPS
ORAL_CAPSULE | ORAL | Status: AC
  Administered 2018-05-15: 300 mg via ORAL
  Filled 2018-05-15: qty 1

## 2018-05-15 MED ORDER — PHENYLEPHRINE HCL 10 MG/ML IJ SOLN
INTRAMUSCULAR | Status: DC | PRN
  Administered 2018-05-15 (×3): 100 ug via INTRAVENOUS

## 2018-05-15 MED ORDER — PROPOFOL 10 MG/ML IV BOLUS
INTRAVENOUS | Status: DC | PRN
  Administered 2018-05-15: 140 mg via INTRAVENOUS

## 2018-05-15 MED ORDER — FENTANYL CITRATE (PF) 100 MCG/2ML IJ SOLN
INTRAMUSCULAR | Status: DC | PRN
  Administered 2018-05-15 (×2): 50 ug via INTRAVENOUS

## 2018-05-15 MED ORDER — BUPIVACAINE HCL 0.5 % IJ SOLN
INTRAMUSCULAR | Status: DC | PRN
  Administered 2018-05-15: 11 mL

## 2018-05-15 MED ORDER — ROCURONIUM BROMIDE 100 MG/10ML IV SOLN
INTRAVENOUS | Status: DC | PRN
  Administered 2018-05-15: 10 mg via INTRAVENOUS
  Administered 2018-05-15: 35 mg via INTRAVENOUS

## 2018-05-15 MED ORDER — ACETAMINOPHEN 500 MG PO TABS
ORAL_TABLET | ORAL | Status: AC
  Administered 2018-05-15: 1000 mg via ORAL
  Filled 2018-05-15: qty 2

## 2018-05-15 MED ORDER — FAMOTIDINE 20 MG PO TABS
20.0000 mg | ORAL_TABLET | Freq: Once | ORAL | Status: AC
  Administered 2018-05-15: 20 mg via ORAL

## 2018-05-15 MED ORDER — LACTATED RINGERS IV SOLN
INTRAVENOUS | Status: DC
  Administered 2018-05-15: 07:00:00 via INTRAVENOUS

## 2018-05-15 MED ORDER — ONDANSETRON HCL 4 MG/2ML IJ SOLN
4.0000 mg | Freq: Once | INTRAMUSCULAR | Status: AC | PRN
  Administered 2018-05-15: 4 mg via INTRAVENOUS

## 2018-05-15 MED ORDER — ONDANSETRON HCL 4 MG/2ML IJ SOLN
INTRAMUSCULAR | Status: AC
  Filled 2018-05-15: qty 2

## 2018-05-15 MED ORDER — OXYCODONE HCL 5 MG PO TABS
5.0000 mg | ORAL_TABLET | Freq: Once | ORAL | Status: AC
  Administered 2018-05-15: 5 mg via ORAL

## 2018-05-15 MED ORDER — DEXAMETHASONE SODIUM PHOSPHATE 4 MG/ML IJ SOLN
INTRAMUSCULAR | Status: AC
  Filled 2018-05-15: qty 1

## 2018-05-15 MED ORDER — ROCURONIUM BROMIDE 50 MG/5ML IV SOLN
INTRAVENOUS | Status: AC
  Filled 2018-05-15: qty 1

## 2018-05-15 MED ORDER — LIDOCAINE HCL (PF) 2 % IJ SOLN
INTRAMUSCULAR | Status: AC
  Filled 2018-05-15: qty 10

## 2018-05-15 MED ORDER — LIDOCAINE HCL (CARDIAC) PF 100 MG/5ML IV SOSY
PREFILLED_SYRINGE | INTRAVENOUS | Status: DC | PRN
  Administered 2018-05-15: 60 mg via INTRAVENOUS

## 2018-05-15 SURGICAL SUPPLY — 39 items
ANCHOR TIS RET SYS 235ML (MISCELLANEOUS) ×2 IMPLANT
BAG URINE DRAINAGE (UROLOGICAL SUPPLIES) ×5 IMPLANT
BLADE SURG SZ11 CARB STEEL (BLADE) ×5 IMPLANT
CANISTER SUCT 1200ML W/VALVE (MISCELLANEOUS) ×5 IMPLANT
CATH ROBINSON RED A/P 16FR (CATHETERS) ×5 IMPLANT
CHLORAPREP W/TINT 26 (MISCELLANEOUS) ×5 IMPLANT
CLOSURE WOUND 1/4X4 (GAUZE/BANDAGES/DRESSINGS) ×1
COVER WAND RF STERILE (DRAPES) ×5 IMPLANT
DRSG TEGADERM 2-3/8X2-3/4 SM (GAUZE/BANDAGES/DRESSINGS) ×5 IMPLANT
GLOVE BIO SURGEON STRL SZ8 (GLOVE) ×10 IMPLANT
GOWN STRL REUS W/ TWL LRG LVL3 (GOWN DISPOSABLE) ×6 IMPLANT
GOWN STRL REUS W/ TWL XL LVL3 (GOWN DISPOSABLE) ×3 IMPLANT
GOWN STRL REUS W/TWL LRG LVL3 (GOWN DISPOSABLE) ×4
GOWN STRL REUS W/TWL XL LVL3 (GOWN DISPOSABLE) ×2
GRASPER SUT TROCAR 14GX15 (MISCELLANEOUS) ×5 IMPLANT
IRRIGATION STRYKERFLOW (MISCELLANEOUS) ×3 IMPLANT
IRRIGATOR STRYKERFLOW (MISCELLANEOUS) ×5
IV NS 1000ML (IV SOLUTION) ×2
IV NS 1000ML BAXH (IV SOLUTION) ×3 IMPLANT
KIT TURNOVER CYSTO (KITS) ×5 IMPLANT
LABEL OR SOLS (LABEL) ×5 IMPLANT
NS IRRIG 500ML POUR BTL (IV SOLUTION) ×5 IMPLANT
PACK GYN LAPAROSCOPIC (MISCELLANEOUS) ×5 IMPLANT
PAD OB MATERNITY 4.3X12.25 (PERSONAL CARE ITEMS) ×5 IMPLANT
PAD PREP 24X41 OB/GYN DISP (PERSONAL CARE ITEMS) ×5 IMPLANT
POUCH SPECIMEN RETRIEVAL 10MM (ENDOMECHANICALS) ×5 IMPLANT
SET TUBE SMOKE EVAC HIGH FLOW (TUBING) ×5 IMPLANT
SHEARS HARMONIC ACE PLUS 36CM (ENDOMECHANICALS) ×5 IMPLANT
SLEEVE ENDOPATH XCEL 5M (ENDOMECHANICALS) ×5 IMPLANT
SPONGE GAUZE 2X2 8PLY STER LF (GAUZE/BANDAGES/DRESSINGS) ×1
SPONGE GAUZE 2X2 8PLY STRL LF (GAUZE/BANDAGES/DRESSINGS) ×4 IMPLANT
STRIP CLOSURE SKIN 1/4X4 (GAUZE/BANDAGES/DRESSINGS) ×4 IMPLANT
SUT VIC AB 0 CT1 36 (SUTURE) ×5 IMPLANT
SUT VIC AB 2-0 UR6 27 (SUTURE) ×7 IMPLANT
SUT VIC AB 4-0 SH 27 (SUTURE) ×2
SUT VIC AB 4-0 SH 27XANBCTRL (SUTURE) ×3 IMPLANT
SWABSTK COMLB BENZOIN TINCTURE (MISCELLANEOUS) ×5 IMPLANT
TROCAR ENDO BLADELESS 11MM (ENDOMECHANICALS) ×5 IMPLANT
TROCAR XCEL NON-BLD 5MMX100MML (ENDOMECHANICALS) ×5 IMPLANT

## 2018-05-15 NOTE — Op Note (Signed)
NAME: Deborah Leon, Deborah W. MEDICAL RECORD ZO:[id number] ACCOUNT [id number] DATE OF BIRTH:[birth year] FACILITY: [facility] LOCATION: [facility] PHYSICIAN:THOMAS Josefine Class, MD  OPERATIVE REPORT  DATE OF PROCEDURE:  05/15/2018  PREOPERATIVE DIAGNOSIS:  1.  Chronic pelvic pain.  POSTOPERATIVE DIAGNOSES: 1.  Chronic pelvic pain. 2.  Abdominal pelvic adhesions.  SURGEON:  Laverta Baltimore, MD.  FIRST ASSISTANT:  Leafy Ro, MD  ANESTHESIA:  General endotracheal anesthesia.  PROCEDURES: 1.  Laparoscopic pelvic and abdominal adhesiolysis incorporating greater than 50% of total operating time. 2.  Laparoscopic bilateral salpingo-oophorectomy.  INDICATIONS:  This is a 63 year old gravida 3, para 2 patient with a greater than 43-month history of lower pelvic pain, right greater than left.  The patient had previously undergone adhesiolysis over 20 years ago.  FINDINGS:  Significant bowel and omental adhesions arising from the pelvic brim to halfway to the distance of the liver on the right side.  Left ovary appeared slightly tethered to the left sidewall and the right ovary had a paratubal cyst.  The appendix  was seen, the distal tip slightly adhered to epiploica.  DESCRIPTION OF PROCEDURE:  After adequate general anesthesia, the patient was placed in dorsal supine position, the legs placed in the [place] stirrups.  The patient was prepped and draped in normal sterile fashion.  Timeout was performed.  Straight  catheterization of the bladder yielded 50 mL of clear urine.  A single tooth tenaculum was placed on the anterior cervix and a Kahn cannula was placed in the endocervical canal to be used for uterine manipulation during the procedure.  Gloves were  changed.  Attention was directed to the patient's abdomen.  A 5 mm infraumbilical incision was made after injecting with 0.5% Marcaine.  The 5 mm laparoscope was advanced into the abdominal cavity under direct visualization with the Optiview  cannula.  A  left lower port site was placed 3 cm medial to the left anterior iliac spine and a #11 trocar was advanced under direct visualization.  Initial impression with the grasper revealed significant bowel and abdominal adhesions to the right pelvic and  abdominal sidewall.  Ovaries were bilaterally free with some slight tethering of the left ovary to the left ovarian fossa.  A Harmonic scalpel was brought up and meticulous dissection of the bowel and pelvic adhesions ensued which incorporated greater  than 50% of total operating time.  The adhesions were removed adequately.  Pictures were taken.  The patient's appendix was identified and appendix appeared normal with some slight adherence to an epiploic on the right sidewall.  A third port site was  placed in the right lower quadrant.  A 5 mm trocar was advanced under direct visualization.  A Harmonic scalpel was brought up and meticulous dissection of the bowel and omentum to the right abdominal sidewall.  Dissection incorporating greater than 50%  of total operating time.  Adhesions were removed adequately and the bowel was freed from the sidewall.  The appendix appeared normal with the distal tip slightly adherent to an epiploica.  Attention was directed to the patient's right infundibulopelvic  ligament, which was cauterized with the Kleppingers and the right fallopian tube and ovary were dissected free with Harmonic scalpel.  This occurred after visualization of the right ureter.  Good hemostasis was noted.  Similar procedure was repeated on  the patient's left fallopian tube.  Again, some adherence was noted that required dissection with the Harmonic scalpel.  The left ureter was visualized before dissection.  Infundibulopelvic ligament was cauterized and Harmonic scalpel  was used to dissect  free the left tube and ovary.  Each adnexa was removed independently and tagged independently for evaluation.  Good hemostasis was noted.  Intraoperative  pictures were taken.  The pressure was lowered to 7 mmHg and good hemostasis was noted.  The left  lower port site was closed with several figure-of-eight sutures with the Carter-Thomason cone and the fascial defect was repaired.  The patient's abdomen was deflated and all incisions were closed with interrupted 4-0 Vicryl suture.  The single tooth  tenaculum was removed from the cervix with good hemostasis noted.  Procedure was terminated.  COMPLICATIONS:  There were no complications.  INTRAOPERATIVE FLUIDS:  700 mL.  ESTIMATED BLOOD LOSS:  5 mL.  URINE OUTPUT:  50 mL.  DISPOSITION:  The patient was taken to recovery room in good condition.  AN/NUANCE  D:05/15/2018 T:05/15/2018 JOB:[id number]

## 2018-05-15 NOTE — Anesthesia Post-op Follow-up Note (Signed)
Anesthesia QCDR form completed.        

## 2018-05-15 NOTE — Anesthesia Postprocedure Evaluation (Signed)
Anesthesia Post Note  Patient: Deborah Leon  Procedure(s) Performed: LAPAROSCOPIC BILATERAL SALPINGO OOPHORECTOMY (Bilateral ) LAPAROSCOPY OPERATIVE (N/A ) LAPAROSCOPIC LYSIS OF ADHESIONS  Patient location during evaluation: PACU Anesthesia Type: General Level of consciousness: awake and alert Pain management: pain level controlled Vital Signs Assessment: post-procedure vital signs reviewed and stable Respiratory status: spontaneous breathing and respiratory function stable Cardiovascular status: stable Anesthetic complications: no     Last Vitals:  Vitals:   05/15/18 0949 05/15/18 1004  BP: 119/62 (!) 111/51  Pulse: 66 75  Resp: 15 14  Temp:    SpO2: 99% (!) 86%    Last Pain:  Vitals:   05/15/18 1004  TempSrc:   PainSc: 8                  Errika Narvaiz K

## 2018-05-15 NOTE — Progress Notes (Signed)
Pt is ready for L/S BSO and possible LOA . All questions answered . LAbs reviewed Proceed

## 2018-05-15 NOTE — Anesthesia Procedure Notes (Signed)
Procedure Name: Intubation Date/Time: 05/15/2018 7:50 AM Performed by: Chanetta Marshall, CRNA Pre-anesthesia Checklist: Patient identified, Emergency Drugs available, Suction available and Patient being monitored Patient Re-evaluated:Patient Re-evaluated prior to induction Oxygen Delivery Method: Circle system utilized Preoxygenation: Pre-oxygenation with 100% oxygen Induction Type: IV induction Ventilation: Mask ventilation without difficulty Laryngoscope Size: Mac and 3 Grade View: Grade II Tube type: Oral Tube size: 7.0 mm Number of attempts: 1 Airway Equipment and Method: Stylet Placement Confirmation: ETT inserted through vocal cords under direct vision,  breath sounds checked- equal and bilateral,  CO2 detector and positive ETCO2 Secured at: 21 cm Tube secured with: Tape Dental Injury: Teeth and Oropharynx as per pre-operative assessment

## 2018-05-15 NOTE — Discharge Instructions (Signed)
  AMBULATORY SURGERY  DISCHARGE INSTRUCTIONS   1) The drugs that you were given will stay in your system until tomorrow so for the next 24 hours you should not:  A) Drive an automobile B) Make any legal decisions C) Drink any alcoholic beverage   2) You may resume regular meals tomorrow.  Today it is better to start with liquids and gradually work up to solid foods.  You may eat anything you prefer, but it is better to start with liquids, then soup and crackers, and gradually work up to solid foods.   3) Please notify your doctor immediately if you have any unusual bleeding, trouble breathing, redness and pain at the surgery site, drainage, fever, or pain not relieved by medication.    4) Additional Instructions: TAKE A STOOL SOFTENER TWICE A DAY WHILE TAKING NARCOTIC PAIN MEDICINE TO PREVENT CONSTIPATION   Please contact your physician with any problems or Same Day Surgery at [phone], Monday through Friday 6 am to 4 pm, or [facility] Main number at [phone].   

## 2018-05-15 NOTE — Anesthesia Preprocedure Evaluation (Signed)
Anesthesia Evaluation  Patient identified by MRN, date of birth, ID band Patient awake    Reviewed: Allergy & Precautions, NPO status , Patient's Chart, lab work & pertinent test results  History of Anesthesia Complications Negative for: history of anesthetic complications  Airway Mallampati: II       Dental  (+) Partial Upper   Pulmonary neg sleep apnea, neg COPD, Current Smoker,           Cardiovascular hypertension, Pt. on medications (-) Past MI and (-) CHF (-) dysrhythmias (-) Valvular Problems/Murmurs     Neuro/Psych neg Seizures    GI/Hepatic Neg liver ROS, neg GERD  ,  Endo/Other  neg diabetes  Renal/GU negative Renal ROS     Musculoskeletal   Abdominal   Peds  Hematology   Anesthesia Other Findings   Reproductive/Obstetrics                             Anesthesia Physical Anesthesia Plan  ASA: II  Anesthesia Plan: General   Post-op Pain Management:    Induction: Intravenous  PONV Risk Score and Plan: 2 and Dexamethasone and Ondansetron  Airway Management Planned: Oral ETT  Additional Equipment:   Intra-op Plan:   Post-operative Plan:   Informed Consent: I have reviewed the patients History and Physical, chart, labs and discussed the procedure including the risks, benefits and alternatives for the proposed anesthesia with the patient or authorized representative who has indicated his/her understanding and acceptance.       Plan Discussed with:   Anesthesia Plan Comments:         Anesthesia Quick Evaluation

## 2018-05-15 NOTE — Brief Op Note (Signed)
05/15/2018  9:21 AM  PATIENT:  Deborah Leon  63 y.o. female  PRE-OPERATIVE DIAGNOSIS:  chronic pelvic pain  POST-OPERATIVE DIAGNOSIS:  chronic pelvic pain Abdominopelvic adhesion PROCEDURE:  Procedure(s) with comments: LAPAROSCOPIC BILATERAL SALPINGO OOPHORECTOMY (Bilateral) LAPAROSCOPY OPERATIVE (N/A) LAPAROSCOPIC LYSIS OF ADHESIONS - Pelvic Abdominal   SURGEON:  Surgeon(s) and Role:    * Schermerhorn, Gwen Her, MD - Primary    * Benjaman Kindler, MD - Assisting  PHYSICIAN ASSISTANT:   ASSISTANTS: none   ANESTHESIA:   general  EBL:  5 mL   BLOOD ADMINISTERED:none  DRAINS: none and Urinary Catheter (Foley)   LOCAL MEDICATIONS USED:  MARCAINE     SPECIMEN:  Source of Specimen:  right and left tube and ovaries  DISPOSITION OF SPECIMEN:  PATHOLOGY  COUNTS:  YES  TOURNIQUET:  * No tourniquets in log *  DICTATION: .Other Dictation: Dictation Number verbal  PLAN OF CARE: Discharge to home after PACU  PATIENT DISPOSITION:  PACU - hemodynamically stable.   Delay start of Pharmacological VTE agent (>24hrs) due to surgical blood loss or risk of bleeding: not applicable

## 2018-05-15 NOTE — Transfer of Care (Signed)
Immediate Anesthesia Transfer of Care Note  Patient: Deborah Leon  Procedure(s) Performed: LAPAROSCOPIC BILATERAL SALPINGO OOPHORECTOMY (Bilateral ) LAPAROSCOPY OPERATIVE (N/A ) LAPAROSCOPIC LYSIS OF ADHESIONS  Patient Location: PACU  Anesthesia Type:General  Level of Consciousness: awake, alert  and oriented  Airway & Oxygen Therapy: Patient Spontanous Breathing and Patient connected to face mask oxygen  Post-op Assessment: Report given to RN and Post -op Vital signs reviewed and stable  Post vital signs: Reviewed and stable  Last Vitals:  Vitals Value Taken Time  BP    Temp    Pulse    Resp    SpO2      Last Pain:  Vitals:   05/15/18 0649  TempSrc: Tympanic  PainSc: 4          Complications: No apparent anesthesia complications

## 2018-05-16 LAB — SURGICAL PATHOLOGY

## 2019-01-03 ENCOUNTER — Other Ambulatory Visit: Payer: Self-pay | Admitting: Gerontology

## 2019-01-03 DIAGNOSIS — Z1231 Encounter for screening mammogram for malignant neoplasm of breast: Secondary | ICD-10-CM

## 2019-04-14 ENCOUNTER — Ambulatory Visit: Payer: Medicare Other | Attending: Internal Medicine

## 2019-04-14 DIAGNOSIS — Z23 Encounter for immunization: Secondary | ICD-10-CM

## 2019-04-14 NOTE — Progress Notes (Signed)
   Covid-19 Vaccination Clinic  Name:  Deborah Leon    MRN: [id number] DOB: 22-Jul-1955  04/14/2019  [name] was observed post Covid-19 immunization for 30 minutes based on pre-vaccination screening without incidence. She was provided with Vaccine Information Sheet and instruction to access the V-Safe system.   [name] was instructed to call 911 with any severe reactions post vaccine: Marland Kitchen Difficulty breathing  . Swelling of your face and throat  . A fast heartbeat  . A bad rash all over your body  . Dizziness and weakness    Immunizations Administered    Name Date Dose VIS Date Route   Pfizer COVID-19 Vaccine 04/14/2019 10:37 AM 0.3 mL 02/09/2019 Intramuscular   Manufacturer: [employer]   Lot: [id number]   [place]: [id number]

## 2019-04-17 ENCOUNTER — Telehealth: Payer: Self-pay | Admitting: *Deleted

## 2019-04-17 NOTE — Telephone Encounter (Signed)
(  04/17/2019) Could not leave a message for patient. Will call again to notify them that it is time to schedule annual low dose lung cancer screening CT scan. SRW

## 2019-04-19 ENCOUNTER — Encounter: Payer: Self-pay | Admitting: *Deleted

## 2019-05-05 ENCOUNTER — Ambulatory Visit: Payer: Medicare Other | Attending: Internal Medicine

## 2019-05-05 DIAGNOSIS — Z23 Encounter for immunization: Secondary | ICD-10-CM

## 2019-05-05 NOTE — Progress Notes (Signed)
   Covid-19 Vaccination Clinic  Name:  Deborah Leon    MRN: [id number] DOB: 12/07/1955  05/05/2019  Deborah Leon was observed post Covid-19 immunization for 30 minutes based on pre-vaccination screening without incident. She was provided with Vaccine Information Sheet and instruction to access the V-Safe system.   Deborah Leon was instructed to call 911 with any severe reactions post vaccine: Marland Kitchen Difficulty breathing  . Swelling of face and throat  . A fast heartbeat  . A bad rash all over body  . Dizziness and weakness   Immunizations Administered    Name Date Dose VIS Date Route   Pfizer COVID-19 Vaccine 05/05/2019 10:23 AM 0.3 mL 02/09/2019 Intramuscular   Manufacturer: [place]   Lot: [id number]   [place]: [id number]

## 2019-05-11 ENCOUNTER — Telehealth: Payer: Self-pay | Admitting: *Deleted

## 2019-05-11 NOTE — Telephone Encounter (Signed)
(  05/11/19) Could not leave message for pt to notify them that it is time to schedule annual low dose lung cancer screening CT scan. Will call back to verify information prior to the scan being scheduled SRW

## 2019-08-17 ENCOUNTER — Telehealth: Payer: Self-pay | Admitting: *Deleted

## 2019-08-17 NOTE — Telephone Encounter (Signed)
(  08/17/2019) Unable to leave message for pt to notify them that it is time to schedule annual low dose lung cancer screening CT scan. Will call back to verify information prior to the scan being scheduled SRW

## 2019-08-20 ENCOUNTER — Encounter: Payer: Self-pay | Admitting: *Deleted

## 2019-09-27 ENCOUNTER — Encounter: Payer: Self-pay | Admitting: *Deleted

## 2020-01-04 ENCOUNTER — Other Ambulatory Visit: Payer: Self-pay | Admitting: Gerontology

## 2020-01-04 DIAGNOSIS — Z1231 Encounter for screening mammogram for malignant neoplasm of breast: Secondary | ICD-10-CM

## 2021-01-02 ENCOUNTER — Other Ambulatory Visit: Payer: Self-pay | Admitting: Gerontology

## 2021-01-02 DIAGNOSIS — Z1231 Encounter for screening mammogram for malignant neoplasm of breast: Secondary | ICD-10-CM

## 2021-02-14 ENCOUNTER — Ambulatory Visit (INDEPENDENT_AMBULATORY_CARE_PROVIDER_SITE_OTHER): Payer: Medicare Other

## 2021-02-14 ENCOUNTER — Other Ambulatory Visit: Payer: Self-pay

## 2021-02-14 ENCOUNTER — Ambulatory Visit
Admission: EM | Admit: 2021-02-14 | Discharge: 2021-02-14 | Disposition: A | Discharge status: Home / Self Care | Payer: Medicare Other

## 2021-02-14 ENCOUNTER — Encounter: Payer: Self-pay | Admitting: Gynecology

## 2021-02-14 DIAGNOSIS — J189 Pneumonia, unspecified organism: Secondary | ICD-10-CM | POA: Diagnosis not present

## 2021-02-14 DIAGNOSIS — R059 Cough, unspecified: Secondary | ICD-10-CM | POA: Diagnosis not present

## 2021-02-14 MED ORDER — ALBUTEROL SULFATE HFA 108 (90 BASE) MCG/ACT IN AERS: 2.0000 | INHALATION_SPRAY | RESPIRATORY_TRACT | 0 refills | Status: AC | PRN

## 2021-02-14 MED ORDER — PROMETHAZINE-DM 6.25-15 MG/5ML PO SYRP: 5.0000 mL | ORAL_SOLUTION | Freq: Four times a day (QID) | ORAL | 0 refills | Status: DC | PRN

## 2021-02-14 MED ORDER — BENZONATATE 100 MG PO CAPS: 200.0000 mg | ORAL_CAPSULE | Freq: Three times a day (TID) | ORAL | 0 refills | Status: DC

## 2021-02-14 MED ORDER — LEVOFLOXACIN 500 MG PO TABS: 500.0000 mg | ORAL_TABLET | Freq: Every day | ORAL | 0 refills | Status: DC

## 2021-02-14 MED ORDER — IPRATROPIUM BROMIDE 0.06 % NA SOLN: 2.0000 | Freq: Four times a day (QID) | NASAL | 12 refills | Status: DC

## 2021-02-14 MED ORDER — AEROCHAMBER MV MISC: 2 refills | Status: AC

## 2021-02-14 MED ORDER — PREDNISONE 50 MG PO TABS: ORAL_TABLET | ORAL | 0 refills | Status: DC

## 2021-02-14 NOTE — ED Provider Notes (Signed)
[facility]    CSN: [id number] Arrival date & time: 02/14/21  0841      History   Chief Complaint No chief complaint on file.   HPI Deborah Leon is a 65 y.o. female.   HPI  Six 62-year-old female here for evaluation of respiratory complaints.  Patient reports that she developed runny nose, nasal congestion, and postnasal drip approximately a week ago.  In the last 2 to 3 days she has developed cough, chest congestion, and has been feeling weak and tired.  She is also complaining of ear pressure in her right ear.  Today she developed a fever with a T-max of 100.9.  She states that her nasal discharge is white in color and her cough is productive for white sputum.  She also endorses shortness of breath and wheezing.  Denies sore throat.  Past Medical History:  Diagnosis Date   Cancer ([place])    skin ca   Hypertension     There are no problems to display for this patient.   Past Surgical History:  Procedure Laterality Date   BREAST BIOPSY Left 02/16/01   bx/u/s -neg   BREAST BIOPSY Left 03/11/10   bx/us-neg   LAPAROSCOPIC BILATERAL SALPINGO OOPHERECTOMY Bilateral 05/15/2018   Procedure: LAPAROSCOPIC BILATERAL SALPINGO OOPHORECTOMY;  Surgeon: Schermerhorn, Gwen Her, MD;  Location: [facility] OR[facility];  Service: Gynecology;  Laterality: Bilateral;   LAPAROSCOPIC LYSIS OF ADHESIONS     LAPAROSCOPIC LYSIS OF ADHESIONS  05/15/2018   Procedure: LAPAROSCOPIC LYSIS OF ADHESIONS;  Surgeon: Ouida Sills, Gwen Her, MD;  Location: [facility];  Service: Gynecology;;  Pelvic Abdominal    LAPAROSCOPY N/A 05/15/2018   Procedure: LAPAROSCOPY OPERATIVE;  Surgeon: Boykin Nearing, MD;  Location: [facility] OR[facility];  Service: Gynecology;  Laterality: N/A;   NASAL SEPTUM SURGERY      OB History   No obstetric history on file.      Home Medications    Prior to Admission medications   Medication Sig Start Date End Date Taking? Authorizing Provider  albuterol (VENTOLIN HFA) 108 (90 Base)  MCG/ACT inhaler Inhale 2 puffs into the lungs every 4 (four) hours as needed. 02/14/21  Yes Margarette Canada, NP  aspirin 81 MG EC tablet Take by mouth. 01/03/19  Yes [provider]  benzonatate (TESSALON) 100 MG capsule Take 2 capsules (200 mg total) by mouth every 8 (eight) hours. 02/14/21  Yes Margarette Canada, NP  Cholecalciferol 50 MCG (2000 UT) CAPS Take by mouth. 07/06/18  Yes [provider]  Glucosamine Sulfate 500 MG TABS Take 500 mg by mouth daily.   Yes [provider]  ipratropium (ATROVENT) 0.06 % nasal spray Place 2 sprays into both nostrils 4 (four) times daily. 02/14/21  Yes Margarette Canada, NP  levofloxacin (LEVAQUIN) 500 MG tablet Take 1 tablet (500 mg total) by mouth daily. 02/14/21  Yes Margarette Canada, NP  lisinopril-hydrochlorothiazide (PRINZIDE,ZESTORETIC) 10-12.5 MG tablet Take 1 tablet by mouth daily. 03/16/18  Yes [provider]  meloxicam (MOBIC) 7.5 MG tablet Take by mouth. 01/02/21 01/02/22 Yes [provider]  Multiple Vitamins-Minerals (MULTIVITAMIN ADULT PO) Take 1 tablet by mouth daily.   Yes [provider]  Omega 3 1200 MG CAPS Take 1,200 mg by mouth daily.   Yes [provider]  pravastatin (PRAVACHOL) 10 MG tablet Take 10 mg by mouth at bedtime.  04/17/18  Yes [provider]  predniSONE (DELTASONE) 50 MG tablet Take 1 tablet daily by mouth for 5 days. 02/14/21  Yes  Margarette Canada, NP  promethazine-dextromethorphan (PROMETHAZINE-DM) 6.25-15 MG/5ML syrup Take 5 mLs by mouth 4 (four) times daily as needed. 02/14/21  Yes Margarette Canada, NP  Spacer/Aero-Holding Josiah Lobo (AEROCHAMBER MV) inhaler Use as instructed 02/14/21  Yes Margarette Canada, NP    Family History Family History  Problem Relation Age of Onset   Breast cancer Neg Hx     Social History Social History   Tobacco Use   Smoking status: Every Day    Packs/day: 1.50    Years: 48.00    Pack years: 72.00    Types: Cigarettes   Smokeless tobacco:  Never  Vaping Use   Vaping Use: Never used  Substance Use Topics   Alcohol use: Never   Drug use: Never     Allergies   Penicillins   Review of Systems Review of Systems  Constitutional:  Positive for fatigue and fever. Negative for activity change and appetite change.  HENT:  Positive for congestion, ear pain and rhinorrhea. Negative for sore throat.   Respiratory:  Positive for cough, shortness of breath and wheezing.   Skin:  Negative for rash.  Hematological: Negative.   Psychiatric/Behavioral: Negative.      Physical Exam Triage Vital Signs ED Triage Vitals  Enc Vitals Group     BP 02/14/21 0919 (!) 146/70     Pulse Rate 02/14/21 0919 (!) 103     Resp 02/14/21 0919 16     Temp 02/14/21 0919 100.1 F (37.8 C)     Temp Source 02/14/21 0919 Oral     SpO2 02/14/21 0919 90 %     Weight 02/14/21 0920 135 lb (61.2 kg)     Height 02/14/21 0920 5\' 6"  (1.676 m)     Head Circumference --      Peak Flow --      Pain Score 02/14/21 0920 7     Pain Loc --      Pain Edu? --      Excl. in [place]? --    No data found.  Updated Vital Signs BP (!) 146/70    Pulse (!) 103    Temp 100.1 F (37.8 C) (Oral)    Resp 16    Ht 5\' 6"  (1.676 m)    Wt 135 lb (61.2 kg)    SpO2 94%    BMI 21.79 kg/m   Visual Acuity Right Eye Distance:   Left Eye Distance:   Bilateral Distance:    Right Eye Near:   Left Eye Near:    Bilateral Near:     Physical Exam Vitals and nursing note reviewed.  Constitutional:      General: She is not in acute distress.    Appearance: Normal appearance. She is not ill-appearing.  HENT:     Head: Normocephalic and atraumatic.     Right Ear: Tympanic membrane, ear canal and external ear normal. There is no impacted cerumen.     Left Ear: Tympanic membrane, ear canal and external ear normal. There is no impacted cerumen.     Nose: Congestion and rhinorrhea present.     Mouth/Throat:     Mouth: Mucous membranes are moist.     Pharynx: Oropharynx is clear.  Posterior oropharyngeal erythema present.  Cardiovascular:     Rate and Rhythm: Normal rate and regular rhythm.     Pulses: Normal pulses.     Heart sounds: Normal heart sounds. No murmur heard.   No gallop.  Pulmonary:     Effort: Pulmonary effort  is normal.     Breath sounds: Rales present. No wheezing or rhonchi.  Musculoskeletal:     Cervical back: Normal range of motion and neck supple.  Lymphadenopathy:     Cervical: No cervical adenopathy.  Skin:    General: Skin is warm and dry.     Capillary Refill: Capillary refill takes less than 2 seconds.     Findings: No erythema or rash.  Neurological:     General: No focal deficit present.     Mental Status: She is alert and oriented to person, place, and time.  Psychiatric:        Mood and Affect: Mood normal.        Behavior: Behavior normal.        Thought Content: Thought content normal.        Judgment: Judgment normal.     UC Treatments / Results  Labs (all labs ordered are listed, but only abnormal results are displayed) Labs Reviewed - No data to display  EKG   Radiology DG Chest 2 View  Result Date: 02/14/2021 CLINICAL DATA:  Cough and congestion for a week.  Mild hypoxia EXAM: CHEST - 2 VIEW COMPARISON:  Chest CT 04/24/2018 FINDINGS: Suspected left lower lobe infiltrate on the frontal view but not confirmed on the lateral view. There is no edema, effusion, or pneumothorax. Calcific density over the right upper lobe which is posterior, stable from prior CT. IMPRESSION: Equivocal for left lower lobe infiltrate with uncertainty related to nonvisualization on the lateral view. Electronically Signed   By: Jorje Guild M.D.   On: 02/14/2021 09:41    Procedures Procedures (including critical care time)  Medications Ordered in UC Medications - No data to display  Initial Impression / Assessment and Plan / UC Course  I have reviewed the triage vital signs and the nursing notes.  Pertinent labs & imaging results  that were available during my care of the patient were reviewed by me and considered in my medical decision making (see chart for details).  Patient is a pleasant, nontoxic appearing six 14-year-old female here for evaluation of cough, shortness of breath, wheezing with other associated upper respiratory complaints that has been ongoing for the past week and worsening of the last 2 days.  Initially patient presented with fever, tachycardia, and an SPO2 of 90%.  This eventually came up to 94% on room air.  Patient's respiratory rate is 16 so she is not having any tachypnea.  Chest x-ray was obtained at triage.  On physical exam patient has pearly gray tympanic membranes bilaterally with normal light reflex and clear external auditory canals.  Nasal mucosa is erythematous and edematous with dried yellow discharge in both nares.  Oropharyngeal exam reveals posterior oropharyngeal erythema without injection.  No postnasal drip visualized.  No cervical lymphadenopathy appreciated on exam.  Cardiopulmonary exam reveals fine rales in the left lower lobe.  The remainder the lung fields are clear.  The chest x-ray is being read as equivocal for left lower lobe infiltrate with uncertainty as its not well visualized in the lateral view.  Given patient's fever, cough, and adventitious lung sounds I will treat the patient for community-acquired pneumonia.  She has an allergy to penicillin but she is unsure of what her allergy is.  Due to this I am reluctant to prescribe dual therapy as I do not know if she would have an adverse reaction to a cephalosporin.  For this reason I will prescribe Levaquin once daily for  7 days.  I am also going to prescribe a 5-day course of prednisone.  Patient has no history of diabetes.  Additionally, I Minna give her Ladona Ridgel for cough for the day and Promethazine DM cough syrup she can use at bedtime.  I have also prescribed an albuterol inhaler with a spacer to help with shortness breath  and wheezing.  Patient was instructed to return for reevaluation, or go to the ER, for any new or worsening symptoms.   Final Clinical Impressions(s) / UC Diagnoses   Final diagnoses:  Community acquired pneumonia of left lower lobe of lung     Discharge Instructions      Take the levaquin daily for 7 days for treatment of yoru penumonia.  Use the albuterol inhaler with a spacer, 2 puffs every 4-6 hours, as needed for shortness of breath or wheezing.  Use the Tessalon Perles every 8 hours during the day as needed for cough.  Taken with a small sip of water.  These may give you numbness to the base of your tongue or metallic taste in mouth, this is normal.  Use the Promethazine DM cough syrup at bedtime for cough and congestion as it would make you drowsy.  Return for reevaluation if you have any new or worsening symptoms.  Take the prednisone daily at breakfast and take it with food. You will take it for 5 days.  Follow-up with your primary care provider in 4 to 6 weeks for a repeat chest x-ray to ensure resolution of your pneumonia.      ED Prescriptions     Medication Sig Dispense Auth. Provider   levofloxacin (LEVAQUIN) 500 MG tablet Take 1 tablet (500 mg total) by mouth daily. 7 tablet Margarette Canada, NP   predniSONE (DELTASONE) 50 MG tablet Take 1 tablet daily by mouth for 5 days. 5 tablet Margarette Canada, NP   albuterol (VENTOLIN HFA) 108 (90 Base) MCG/ACT inhaler Inhale 2 puffs into the lungs every 4 (four) hours as needed. 18 g Margarette Canada, NP   Spacer/Aero-Holding Chambers (AEROCHAMBER MV) inhaler Use as instructed 1 each Margarette Canada, NP   benzonatate (TESSALON) 100 MG capsule Take 2 capsules (200 mg total) by mouth every 8 (eight) hours. 21 capsule Margarette Canada, NP   ipratropium (ATROVENT) 0.06 % nasal spray Place 2 sprays into both nostrils 4 (four) times daily. 15 mL Margarette Canada, NP   promethazine-dextromethorphan (PROMETHAZINE-DM) 6.25-15 MG/5ML syrup Take 5 mLs by  mouth 4 (four) times daily as needed. 118 mL Margarette Canada, NP      PDMP not reviewed this encounter.   Margarette Canada, NP 02/14/21 1034

## 2021-02-14 NOTE — Discharge Instructions (Signed)
Take the levaquin daily for 7 days for treatment of yoru penumonia.  Use the albuterol inhaler with a spacer, 2 puffs every 4-6 hours, as needed for shortness of breath or wheezing.  Use the Tessalon Perles every 8 hours during the day as needed for cough.  Taken with a small sip of water.  These may give you numbness to the base of your tongue or metallic taste in mouth, this is normal.  Use the Promethazine DM cough syrup at bedtime for cough and congestion as it would make you drowsy.  Return for reevaluation if you have any new or worsening symptoms.  Take the prednisone daily at breakfast and take it with food. You will take it for 5 days.  Follow-up with your primary care provider in 4 to 6 weeks for a repeat chest x-ray to ensure resolution of your pneumonia.

## 2021-02-14 NOTE — ED Triage Notes (Signed)
Patient stated that  x 1 week ago with nasal drip. Pt c/o now with cough and chest congestion. Per pt. Temperature at home this morning of 100.9. Patient also stated that she took 2 Covid test at home with negative result.. Per patient she don't think she has COVID. Pt believe it is pneumonia.

## 2021-05-18 ENCOUNTER — Other Ambulatory Visit: Payer: Self-pay

## 2021-05-18 ENCOUNTER — Ambulatory Visit
Admission: EM | Admit: 2021-05-18 | Discharge: 2021-05-18 | Disposition: A | Discharge status: Home / Self Care | Payer: Medicare Other | Attending: Emergency Medicine | Admitting: Emergency Medicine

## 2021-05-18 DIAGNOSIS — M5441 Lumbago with sciatica, right side: Secondary | ICD-10-CM | POA: Diagnosis not present

## 2021-05-18 MED ORDER — ACETAMINOPHEN 500 MG PO TABS
1000.0000 mg | ORAL_TABLET | Freq: Once | ORAL | Status: AC
  Administered 2021-05-18: 1000 mg via ORAL

## 2021-05-18 MED ORDER — KETOROLAC TROMETHAMINE 60 MG/2ML IM SOLN
30.0000 mg | Freq: Once | INTRAMUSCULAR | Status: AC
  Administered 2021-05-18: 30 mg via INTRAMUSCULAR

## 2021-05-18 MED ORDER — METHYLPREDNISOLONE 4 MG PO TBPK: ORAL_TABLET | Freq: Every day | ORAL | 0 refills | Status: DC

## 2021-05-18 MED ORDER — TIZANIDINE HCL 4 MG PO TABS
4.0000 mg | ORAL_TABLET | Freq: Three times a day (TID) | ORAL | 0 refills | Status: AC | PRN
Start: 2021-05-18 — End: ?

## 2021-05-18 MED ORDER — NAPROXEN 500 MG PO TABS: 500.0000 mg | ORAL_TABLET | Freq: Two times a day (BID) | ORAL | 0 refills | Status: AC

## 2021-05-18 MED ORDER — LIDOCAINE 5 % EX PTCH: 1.0000 | MEDICATED_PATCH | CUTANEOUS | 0 refills | Status: AC

## 2021-05-18 NOTE — ED Triage Notes (Signed)
Patient presents to Urgent Care with complaints of back pain since Friday. Pt states she walked across yard and believes maybe stepping in a hole. Has hx of sciatica. Treating pain with mobic, tylenol, 1 dose of gabapentin.  ?

## 2021-05-18 NOTE — ED Provider Notes (Signed)
HPI ? ?SUBJECTIVE: ? ?Deborah Leon is a 66 y.o. female who presents with 4 days of sharp, stabbing constant right low back pain with radiation down her posterior right leg.  It is present unless she is sitting still, and also states it is better at the end of the day after she has been moving.  Pain started as she was walking across the yard.  No trauma to the back, fall, twisting, recent heavy lifting, fevers, nausea, vomiting, abdominal pain, urinary complaints, syncope, saddle anesthesia, leg weakness, bilateral radicular leg pain, urinary or fecal incontinence, urinary retention, rash, pain worse at night.  She has tried heat, ice, 1000 mg of Tylenol, 1 dose of Neurontin, and takes Mobic on a regular basis for arthritis.  Walking, and sitting perfectly still helps.  Symptoms are worse with any torso movement, bending forward, turning over in bed.  No antipyretic in the past 6 hours.  She has not taken any NSAIDs today. 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  No history of known aortic abdominal aneurysm, diabetes, chronic kidney disease, shingles.  Family history significant for sister with aortic abdominal aneurysm. [facility]CP: [facility]. ? ?Past Medical History:  ?Diagnosis Date  ? Cancer [facility])   ? skin ca  ? Hypertension   ? ? ?Past Surgical History:  ?Procedure Laterality Date  ? BREAST BIOPSY Left 02/16/01  ? bx/u/s -neg  ? BREAST BIOPSY Left 03/11/10  ? bx/us-neg  ? LAPAROSCOPIC BILATERAL SALPINGO OOPHERECTOMY Bilateral 05/15/2018  ? Procedure: LAPAROSCOPIC BILATERAL SALPINGO OOPHORECTOMY;  Surgeon: Schermerhorn, Gwen Her, MD;  Location: [facility] ORS;  Service: Gynecology;  Laterality: Bilateral;  ? LAPAROSCOPIC LYSIS OF ADHESIONS    ? LAPAROSCOPIC LYSIS OF ADHESIONS  05/15/2018  ? Procedure: LAPAROSCOPIC LYSIS OF ADHESIONS;  Surgeon: Schermerhorn, Gwen Her, MD;  Location: [facility] ORS;  Service: Gynecology;;  Pelvic  Abdominal   ? LAPAROSCOPY N/A 05/15/2018  ? Procedure: LAPAROSCOPY OPERATIVE;  Surgeon: Schermerhorn, Gwen Her, MD;  Location: [facility] ORS;  Service: Gynecology;  Laterality: N/A;  ? NASAL SEPTUM SURGERY    ? ? ?Family History  ?Problem Relation Age of Onset  ? Breast cancer Neg Hx   ? ? ?Social History  ? ?Tobacco Use  ? Smoking status: Every Day  ?  Packs/day: 1.50  ?  Years: 48.00  ?  Pack years: 72.00  ?  Types: Cigarettes  ? Smokeless tobacco: Never  ?Vaping Use  ? Vaping Use: Never used  ?Substance Use Topics  ? Alcohol use: Never  ? Drug use: Never  ? ? ?No current facility-administered medications for this encounter. ? ?Current Outpatient Medications:  ?  lidocaine (LIDODERM) 5 %, Place 1 patch onto the skin daily. Remove & Discard patch within 12 hours or as directed by MD, Disp: 6 patch, Rfl: 0 ?  methylPREDNISolone (MEDROL DOSEPAK) 4 MG TBPK tablet, Take by mouth daily. Follow package instructions, Disp: 21 tablet, Rfl: 0 ?  naproxen (NAPROSYN) 500 MG tablet, Take 1 tablet (500 mg total) by mouth 2 (two) times daily for 7 days., Disp: 14 tablet, Rfl: 0 ?  tiZANidine (ZANAFLEX) 4 MG tablet, Take 1 tablet (4 mg total) by mouth every 8 (eight) hours as needed for muscle spasms., Disp: 30 tablet, Rfl: 0 ?  albuterol (VENTOLIN HFA) 108 (90 Base) MCG/ACT inhaler, Inhale 2 puffs into the lungs every 4 (four) hours as needed., Disp: 18 g, Rfl: 0 ?  aspirin 81 MG EC tablet,  Take by mouth., Disp: , Rfl:  ?  Cholecalciferol 50 MCG (2000 UT) CAPS, Take by mouth., Disp: , Rfl:  ?  Glucosamine Sulfate 500 MG TABS, Take 500 mg by mouth daily., Disp: , Rfl:  ?  lisinopril-hydrochlorothiazide (PRINZIDE,ZESTORETIC) 10-12.5 MG tablet, Take 1 tablet by mouth daily., Disp: , Rfl:  ?  Multiple Vitamins-Minerals (MULTIVITAMIN ADULT PO), Take 1 tablet by mouth daily., Disp: , Rfl:  ?  Omega 3 1200 MG CAPS, Take 1,200 mg by mouth daily., Disp: , Rfl:  ?  pravastatin (PRAVACHOL) 10 MG tablet, Take 10 mg by mouth at bedtime. , Disp: ,  Rfl:  ?  Spacer/Aero-Holding Chambers (AEROCHAMBER MV) inhaler, Use as instructed, Disp: 1 each, Rfl: 2 ? ?Allergies  ?Allergen Reactions  ? Penicillins   ?  Did it involve swelling of the face/tongue/throat, SOB, or low BP? Unknown ?Did it involve sudden or severe rash/hives, skin peeling, or any reaction on the inside of your mouth or nose? Unknown ?Did you need to seek medical attention at a hospital or doctor's office? Unknown ?When did it last happen?  Was told as a child     ?If all above answers are ?NO?, may proceed with cephalosporin use. ?  ? ? ? ?ROS ? ?As noted in HPI.  ? ?Physical Exam ? ?BP 135/89 (BP Location: Right Arm)   Pulse 81   Temp 97.8 ?F (36.6 ?C) (Oral)   Resp 16   SpO2 98%  ? ?Constitutional: Well developed, well nourished, no acute distress.  Obvious pain with any movement. ?Eyes:  EOMI, conjunctiva normal bilaterally ?HENT: Normocephalic, atraumatic,mucus membranes moist ?Respiratory: Normal inspiratory effort ?Cardiovascular: Normal rate ?GI: nondistended. No suprapubic, midline tenderness.  No palpable pulsatile abdominal masses ?skin: No rash, skin intact ?Musculoskeletal: no CVAT.  Positive right-sided paralumbar tenderness, no appreciable muscle spasm. No L-spine, SI joint, sacral bony tenderness. Bilateral lower extremities nontender, baseline ROM with intact DP pulses.  Pain with active right hip flexion against resistance, passive abduction/adduction.  No pain with passive int/ext rotation, flex/extension hips, AD/ABduction otherwise. SLR neg bilaterally. Sensation baseline light touch bilaterally for Pt, DTR's symmetric and intact bilaterally KJ , Motor symmetric bilateral 5/5 hip flexion, quadriceps, hamstrings, EHL, foot dorsiflexion, foot plantarflexion, gait somewhat antalgic but without apparent new ataxia. ?Neurologic: Alert & oriented x 3, no focal neuro deficits ?Psychiatric: Speech and behavior appropriate ? ? ?ED Course ? ? ?Medications  ?acetaminophen (TYLENOL)  tablet 1,000 mg (1,000 mg Oral Given 05/18/21 1000)  ?ketorolac (TORADOL) injection 30 mg (30 mg Intramuscular Given 05/18/21 1000)  ? ? ?No orders of the defined types were placed in this encounter. ? ? ?No results found for this or any previous visit (from the past 24 hour(s)). ?No results found. ? ?ED Clinical Impression ? ?1. Acute right-sided low back pain with right-sided sciatica   ? ? ?ED Assessment/Plan ? ?Outside records reviewed.  As noted in HPI. ? ?Patient has no urinary complaints.  Discussed with patient that given her family history, aortic abdominal aneurysm is in the differential, but feel that it is very low today.  This seems to be very musculoskeletal.  Patient states this is identical to previous musculoskeletal low back pain/sciatic pain.  No evidence of spinal cord involvement based on H&P. Pt describing typical back pain, has been < 6 week duration. No historical red flags as noted in HPI. No physical red flags such as fever, bony tenderness, lower extremity weakness, saddle anesthesia. Imaging not indicated at this time.  ? ?  Giving 30 mg of Toradol IM with 1000 mg of Tylenol p.o. she has not taken any NSAIDs today.  Will discontinue the Mobic as it is not helping, try Aleve, which she states does help.  We will have her take Aleve combined with Tylenol twice a day, sent home with Medrol Dosepak, continue heat or ice, whichever feels better, gentle stretching, continue movement and walking.  Follow-up with PCP, especially for screening for aortic abdominal aneurysm, ER return precautions given. ? ?Discussed MDM, treatment plan, and plan for follow-up with patient. Discussed sn/sx that should prompt return to the ED. patient agrees with plan.  ? ?Meds ordered this encounter  ?Medications  ? acetaminophen (TYLENOL) tablet 1,000 mg  ? ketorolac (TORADOL) injection 30 mg  ? lidocaine (LIDODERM) 5 %  ?  Sig: Place 1 patch onto the skin daily. Remove & Discard patch within 12 hours or as directed by  MD  ?  Dispense:  6 patch  ?  Refill:  0  ? methylPREDNISolone (MEDROL DOSEPAK) 4 MG TBPK tablet  ?  Sig: Take by mouth daily. Follow package instructions  ?  Dispense:  21 tablet  ?  Refill:  0  ? naproxen (NAPROS

## 2021-05-18 NOTE — Discharge Instructions (Addendum)
Stop the Mobic.  Take the Aleve with 2 extra strength Tylenols which is 1000 mg twice a day.  You may take an additional 1000 mg of Tylenol 1-2 extra times in 24 hours.  Do not exceed 4000 mg of Tylenol from all sources in 24 hours.  Medrol Dosepak, Zanaflex for muscle spasms, heat or ice, whichever feels better, continue walking and gentle stretching. ?

## 2021-12-27 ENCOUNTER — Encounter: Payer: Self-pay | Admitting: Emergency Medicine

## 2021-12-27 ENCOUNTER — Ambulatory Visit (INDEPENDENT_AMBULATORY_CARE_PROVIDER_SITE_OTHER): Payer: Medicare Other

## 2021-12-27 ENCOUNTER — Ambulatory Visit
Admission: EM | Admit: 2021-12-27 | Discharge: 2021-12-27 | Disposition: A | Discharge status: Home / Self Care | Payer: Medicare Other

## 2021-12-27 DIAGNOSIS — I10 Essential (primary) hypertension: Secondary | ICD-10-CM | POA: Diagnosis not present

## 2021-12-27 DIAGNOSIS — R059 Cough, unspecified: Secondary | ICD-10-CM

## 2021-12-27 DIAGNOSIS — R0981 Nasal congestion: Secondary | ICD-10-CM | POA: Diagnosis not present

## 2021-12-27 DIAGNOSIS — R051 Acute cough: Secondary | ICD-10-CM | POA: Diagnosis not present

## 2021-12-27 MED ORDER — LOSARTAN POTASSIUM-HCTZ 50-12.5 MG PO TABS
1.0000 | ORAL_TABLET | Freq: Every day | ORAL | 0 refills | Status: AC
Start: 2021-12-27 — End: 2022-01-26

## 2021-12-27 MED ORDER — IPRATROPIUM BROMIDE 0.06 % NA SOLN: 2.0000 | Freq: Four times a day (QID) | NASAL | 0 refills | Status: AC

## 2021-12-27 MED ORDER — PROMETHAZINE-DM 6.25-15 MG/5ML PO SYRP: 5.0000 mL | ORAL_SOLUTION | Freq: Every evening | ORAL | 0 refills | Status: AC | PRN

## 2021-12-27 NOTE — Discharge Instructions (Signed)
-  Your x-ray is clear. - Since you have nasal congestion and the cough has become productive, I do not think it is related to the losartan but since your blood pressure is more responsive to losartan I have changed that.  Just let your PCP know. - Increase your rest and fluids and take the cough medicine at bedtime as needed.  Use the nasal spray throughout the day. - Return if fever, worsening cough or breathing trouble, otherwise follow-up with PCP next week as scheduled.

## 2021-12-27 NOTE — ED Triage Notes (Signed)
Patient c/o cough and chest congestion for 2 weeks.  Patient states that she is taking blood pressure medicine and not sure if the medicine is causing her to cough.  Patient states it is not a new medicine.  Patient denies fevers.

## 2021-12-27 NOTE — ED Provider Notes (Signed)
[facility]    CSN: [id number] Arrival date & time: 12/27/21  1036      History   Chief Complaint Chief Complaint  Patient presents with   Cough    HPI Deborah Leon is a 66 y.o. female presenting for cough for the past 2 weeks.  Also reports nasal congestion without drainage.  She says the cough has been dry until yesterday and then she coughed up some greenish sputum.  She says that she feels drainage in her throat.  Patient is convinced that her cough is related to the lisinopril that she has been taking for quite some time.  She says that she stopped taking it yesterday and took her husband's 50 mg losartan with HCTZ.  Reports that her blood pressure has been normal for the first time in a very long time.  Reports that blood pressures were in the 932I systolic before she changed her medication to losartan.  Blood pressure today is 137/66.  She has not taken the medication today.  She is denying any associated fevers, fatigue, sinus pain, chest pain, wheezing or breathing difficulty.  She reports that she contacted her PCPs office and they will not be able to see her until next week.  No other complaints.  HPI  Past Medical History:  Diagnosis Date   Cancer ([place])    skin ca   Hypertension     There are no problems to display for this patient.   Past Surgical History:  Procedure Laterality Date   BREAST BIOPSY Left 02/16/01   bx/u/s -neg   BREAST BIOPSY Left 03/11/10   bx/us-neg   LAPAROSCOPIC BILATERAL SALPINGO OOPHERECTOMY Bilateral 05/15/2018   Procedure: LAPAROSCOPIC BILATERAL SALPINGO OOPHORECTOMY;  Surgeon: Schermerhorn, Gwen Her, MD;  Location: [facility] ORS;  Service: Gynecology;  Laterality: Bilateral;   LAPAROSCOPIC LYSIS OF ADHESIONS     LAPAROSCOPIC LYSIS OF ADHESIONS  05/15/2018   Procedure: LAPAROSCOPIC LYSIS OF ADHESIONS;  Surgeon: Ouida Sills, Gwen Her, MD;  Location: [facility] ORS;  Service: Gynecology;;  Pelvic Abdominal    LAPAROSCOPY N/A 05/15/2018    Procedure: LAPAROSCOPY OPERATIVE;  Surgeon: Boykin Nearing, MD;  Location: [facility] ORS;  Service: Gynecology;  Laterality: N/A;   NASAL SEPTUM SURGERY      OB History   No obstetric history on file.      Home Medications    Prior to Admission medications   Medication Sig Start Date End Date Taking? Authorizing Provider  aspirin 81 MG EC tablet Take by mouth. 01/03/19  Yes [provider]  Cholecalciferol 50 MCG (2000 UT) CAPS Take by mouth. 07/06/18  Yes [provider]  ipratropium (ATROVENT) 0.06 % nasal spray Place 2 sprays into both nostrils 4 (four) times daily. 12/27/21  Yes Danton Clap, PA-C  losartan-hydrochlorothiazide (HYZAAR) 50-12.5 MG tablet Take 1 tablet by mouth daily. 12/27/21 01/26/22 Yes Danton Clap, PA-C  meloxicam (MOBIC) 7.5 MG tablet Take 7.5 mg by mouth daily. 12/16/21  Yes [provider]  Multiple Vitamins-Minerals (MULTIVITAMIN ADULT PO) Take 1 tablet by mouth daily.   Yes [provider]  Omega 3 1200 MG CAPS Take 1,200 mg by mouth daily.   Yes [provider]  pravastatin (PRAVACHOL) 10 MG tablet Take 10 mg by mouth at bedtime.  04/17/18  Yes [provider]  promethazine-dextromethorphan (PROMETHAZINE-DM) 6.25-15 MG/5ML syrup Take 5 mLs by mouth at bedtime as needed for cough. 12/27/21  Yes Laurene Footman B, PA-C  albuterol (VENTOLIN HFA) 108 (90  Base) MCG/ACT inhaler Inhale 2 puffs into the lungs every 4 (four) hours as needed. 02/14/21   Margarette Canada, NP  Glucosamine Sulfate 500 MG TABS Take 500 mg by mouth daily.    [provider]  lidocaine (LIDODERM) 5 % Place 1 patch onto the skin daily. Remove & Discard patch within 12 hours or as directed by MD 05/18/21   Melynda Ripple, MD  Spacer/Aero-Holding Chambers (AEROCHAMBER MV) inhaler Use as instructed 02/14/21   Margarette Canada, NP  tiZANidine (ZANAFLEX) 4 MG tablet Take 1 tablet (4 mg total) by mouth every 8 (eight) hours as needed for  muscle spasms. 05/18/21   Melynda Ripple, MD    Family History Family History  Problem Relation Age of Onset   Breast cancer Neg Hx     Social History Social History   Tobacco Use   Smoking status: Every Day    Packs/day: 1.50    Years: 48.00    Total pack years: 72.00    Types: Cigarettes   Smokeless tobacco: Never  Vaping Use   Vaping Use: Never used  Substance Use Topics   Alcohol use: Never   Drug use: Never     Allergies   Penicillins   Review of Systems Review of Systems  Constitutional:  Negative for chills, diaphoresis, fatigue and fever.  HENT:  Positive for congestion. Negative for ear pain, rhinorrhea, sinus pressure, sinus pain and sore throat.   Respiratory:  Positive for cough. Negative for shortness of breath and wheezing.   Cardiovascular:  Negative for chest pain.  Gastrointestinal:  Negative for abdominal pain, nausea and vomiting.  Musculoskeletal:  Negative for arthralgias and myalgias.  Skin:  Negative for rash.  Neurological:  Negative for weakness and headaches.  Hematological:  Negative for adenopathy.     Physical Exam Triage Vital Signs ED Triage Vitals  Enc Vitals Group     BP 12/27/21 1106 137/66     Pulse Rate 12/27/21 1106 94     Resp 12/27/21 1106 14     Temp 12/27/21 1106 97.9 F (36.6 C)     Temp Source 12/27/21 1106 Oral     SpO2 12/27/21 1106 97 %     Weight 12/27/21 1102 180 lb (81.6 kg)     Height 12/27/21 1102 '5\' 6"'$  (1.676 m)     Head Circumference --      Peak Flow --      Pain Score 12/27/21 1102 0     Pain Loc --      Pain Edu? --      Excl. in [place]? --    No data found.  Updated Vital Signs BP 137/66 (BP Location: Left Arm)   Pulse 94   Temp 97.9 F (36.6 C) (Oral)   Resp 14   Ht '5\' 6"'$  (1.676 m)   Wt 180 lb (81.6 kg)   SpO2 97%   BMI 29.05 kg/m   Physical Exam Vitals and nursing note reviewed.  Constitutional:      General: She is not in acute distress.    Appearance: Normal appearance. She is  not ill-appearing or toxic-appearing.  HENT:     Head: Normocephalic and atraumatic.     Nose: Congestion present.     Mouth/Throat:     Mouth: Mucous membranes are moist.     Pharynx: Oropharynx is clear.  Eyes:     General: No scleral icterus.       Right eye: No discharge.  Left eye: No discharge.     Conjunctiva/sclera: Conjunctivae normal.  Cardiovascular:     Rate and Rhythm: Normal rate and regular rhythm.     Heart sounds: Normal heart sounds.  Pulmonary:     Effort: Pulmonary effort is normal. No respiratory distress.     Breath sounds: Normal breath sounds. No wheezing, rhonchi or rales.  Musculoskeletal:     Cervical back: Neck supple.  Skin:    General: Skin is dry.  Neurological:     General: No focal deficit present.     Mental Status: She is alert. Mental status is at baseline.     Motor: No weakness.     Gait: Gait normal.  Psychiatric:        Mood and Affect: Mood normal.        Behavior: Behavior normal.        Thought Content: Thought content normal.      UC Treatments / Results  Labs (all labs ordered are listed, but only abnormal results are displayed) Labs Reviewed - No data to display  EKG   Radiology DG Chest 2 View  Result Date: 12/27/2021 CLINICAL DATA:  66 year old female with 2 weeks of cough and congestion. EXAM: CHEST - 2 VIEW COMPARISON:  Chest radiographs 02/14/2021 and earlier. FINDINGS: Chronic medial right upper lobe pulmonary granuloma appears stable from a 2020 CT (arrow), which also described emphysema. Stable lung volumes. Normal cardiac size and mediastinal contours. Visualized tracheal air column is within normal limits. Chronic increased interstitial markings are stable. No pneumothorax, pleural effusion or acute pulmonary opacity. No acute osseous abnormality identified. Negative visible bowel gas. IMPRESSION: Stable chronic lung disease.  No acute cardiopulmonary abnormality. Electronically Signed   By: Genevie Ann M.D.    On: 12/27/2021 11:37    Procedures Procedures (including critical care time)  Medications Ordered in UC Medications - No data to display  Initial Impression / Assessment and Plan / UC Course  I have reviewed the triage vital signs and the nursing notes.  Pertinent labs & imaging results that were available during my care of the patient were reviewed by me and considered in my medical decision making (see chart for details).   66 year old female presents for cough and congestion x2 weeks.  Reports cough was dry until yesterday when it became productive of greenish sputum.  Reports that she believes her symptoms are related to the lisinopril that she has been taking for years.  She switched herself to her husband's losartan tablet yesterday and reports that her blood pressure was much better when switching and she wants to take this medication instead.  Appointment with PCP next week.  Vitals are normal and stable and she is overall well-appearing and in no acute distress.  On exam she does have moderate swelling of her nasal mucosal edema and a small little polyp on the right side.  Her chest is clear to auscultation heart regular rate and rhythm.  Chest x-ray performed today was negative for any acute abnormality.  Discussed result with patient.  Given that she has associated nasal congestion and the cough is productive, advised her it is more likely that his symptoms are due to a viral illness.  I do not believe her symptoms are related to the lisinopril.  However, since her blood pressure has been doing better with losartan, I will switch her but she needs to follow-up with her PCP next week and advise them of this change.  I also sent Promethazine  DM for her to take at nighttime because she says the cough is worse at night.  Sent nasal spray-Atrovent as well.  Plenty rest and fluids.  Advised to return if fever, worsening cough or breathing trouble, otherwise follow-up with PCP.   Final  Clinical Impressions(s) / UC Diagnoses   Final diagnoses:  Acute cough  Nasal congestion  Essential hypertension     Discharge Instructions      -Your x-ray is clear. - Since you have nasal congestion and the cough has become productive, I do not think it is related to the losartan but since your blood pressure is more responsive to losartan I have changed that.  Just let your PCP know. - Increase your rest and fluids and take the cough medicine at bedtime as needed.  Use the nasal spray throughout the day. - Return if fever, worsening cough or breathing trouble, otherwise follow-up with PCP next week as scheduled.     ED Prescriptions     Medication Sig Dispense Auth. Provider   losartan-hydrochlorothiazide (HYZAAR) 50-12.5 MG tablet Take 1 tablet by mouth daily. 30 tablet Danton Clap, PA-C   promethazine-dextromethorphan (PROMETHAZINE-DM) 6.25-15 MG/5ML syrup Take 5 mLs by mouth at bedtime as needed for cough. 118 mL Laurene Footman B, PA-C   ipratropium (ATROVENT) 0.06 % nasal spray Place 2 sprays into both nostrils 4 (four) times daily. 15 mL Danton Clap, PA-C      PDMP not reviewed this encounter.   Danton Clap, PA-C 12/27/21 1159

## 2023-07-08 ENCOUNTER — Other Ambulatory Visit: Payer: Self-pay | Admitting: Gerontology

## 2023-07-08 DIAGNOSIS — Z1231 Encounter for screening mammogram for malignant neoplasm of breast: Secondary | ICD-10-CM
